# Patient Record
Sex: Male | Born: 1985 | Race: Black or African American | Hispanic: No | Marital: Single | Smoking: Former smoker
Health system: Southern US, Community
[De-identification: ages and names within clinical notes are randomized; demographics above are authoritative.]

## PROBLEM LIST (undated history)

## (undated) DIAGNOSIS — K219 Gastro-esophageal reflux disease without esophagitis: Secondary | ICD-10-CM

## (undated) DIAGNOSIS — I82629 Acute embolism and thrombosis of deep veins of unspecified upper extremity: Secondary | ICD-10-CM

## (undated) DIAGNOSIS — Z8719 Personal history of other diseases of the digestive system: Secondary | ICD-10-CM

## (undated) DIAGNOSIS — Z8711 Personal history of peptic ulcer disease: Secondary | ICD-10-CM

## (undated) HISTORY — PX: OTHER SURGICAL HISTORY: SHX169

---

## 2008-12-21 ENCOUNTER — Inpatient Hospital Stay: Payer: Self-pay | Admitting: Vascular Surgery

## 2011-09-17 ENCOUNTER — Emergency Department: Payer: Self-pay | Admitting: Emergency Medicine

## 2015-10-06 ENCOUNTER — Emergency Department
Admission: EM | Admit: 2015-10-06 | Discharge: 2015-10-06 | Disposition: A | Discharge status: Home / Self Care | Payer: Self-pay | Attending: Emergency Medicine | Admitting: Emergency Medicine

## 2015-10-06 ENCOUNTER — Encounter: Payer: Self-pay | Admitting: Emergency Medicine

## 2015-10-06 DIAGNOSIS — F17200 Nicotine dependence, unspecified, uncomplicated: Secondary | ICD-10-CM | POA: Insufficient documentation

## 2015-10-06 DIAGNOSIS — L03317 Cellulitis of buttock: Secondary | ICD-10-CM | POA: Insufficient documentation

## 2015-10-06 MED ORDER — CEPHALEXIN 500 MG PO CAPS
500.0000 mg | ORAL_CAPSULE | Freq: Once | ORAL | Status: AC
  Administered 2015-10-06: 500 mg via ORAL
  Filled 2015-10-06: qty 1

## 2015-10-06 MED ORDER — CEPHALEXIN 500 MG PO CAPS: 500.0000 mg | ORAL_CAPSULE | Freq: Three times a day (TID) | ORAL | Status: DC

## 2015-10-06 NOTE — Discharge Instructions (Signed)
Cellulitis Cellulitis is an infection of the skin and the tissue beneath it. The infected area is usually red and tender. Cellulitis occurs most often in the arms and lower legs.  CAUSES  Cellulitis is caused by bacteria that enter the skin through cracks or cuts in the skin. The most common types of bacteria that cause cellulitis are staphylococci and streptococci. SIGNS AND SYMPTOMS   Redness and warmth.  Swelling.  Tenderness or pain.  Fever. DIAGNOSIS  Your health care provider can usually determine what is wrong based on a physical exam. Blood tests may also be done. TREATMENT  Treatment usually involves taking an antibiotic medicine. HOME CARE INSTRUCTIONS   Take your antibiotic medicine as directed by your health care provider. Finish the antibiotic even if you start to feel better.  Keep the infected arm or leg elevated to reduce swelling.  Apply a warm cloth to the affected area up to 4 times per day to relieve pain.  Take medicines only as directed by your health care provider.  Keep all follow-up visits as directed by your health care provider. SEEK MEDICAL CARE IF:   You notice red streaks coming from the infected area.  Your red area gets larger or turns dark in color.  Your bone or joint underneath the infected area becomes painful after the skin has healed.  Your infection returns in the same area or another area.  You notice a swollen bump in the infected area.  You develop new symptoms.  You have a fever. SEEK IMMEDIATE MEDICAL CARE IF:   You feel very sleepy.  You develop vomiting or diarrhea.  You have a general ill feeling (malaise) with muscle aches and pains.   This information is not intended to replace advice given to you by your health care provider. Make sure you discuss any questions you have with your health care provider.   Document Released: 03/17/2005 Document Revised: 02/26/2015 Document Reviewed: 08/23/2011 Elsevier Interactive  Patient Education [employer]2016 [employer].   Your exam shows a cellulitis. You should take the prescription med as directed. Apply warm compresses to promote healing. [facility] or return as needed for wound check.

## 2015-10-06 NOTE — ED Notes (Signed)
States possible bug bite to buttocks last Thursday  Area is larger and having more pain

## 2015-10-06 NOTE — ED Provider Notes (Signed)
[facility] Emergency Department Provider Note ____________________________________________  Time seen: 1652  I have reviewed the triage vital signs and the nursing notes.  HISTORY  Chief Complaint  Abscess  HPI Kelly Brooks is a 30 y.o. male presents to the ED for evaluation of an abscess to the left buttocks. He noted the area about 4 days prior to arrival and notes increasing redness, pain, and tenderness. He denies any interim fevers, chills, sweats.There is no spontaneous drainage to the area. He notes the area is warm, firm, and tender to touch. He rates overall discomfort 6/10 in triage.  History reviewed. No pertinent past medical history.  There are no active problems to display for this patient.  History reviewed. No pertinent past surgical history.  Current Outpatient Rx  Name  Route  Sig  Dispense  Refill  . cephALEXin (KEFLEX) 500 MG capsule   Oral   Take 1 capsule (500 mg total) by mouth 3 (three) times daily.   21 capsule   0    Allergies Shellfish allergy  No family history on file.  Social History Social History  Substance Use Topics  . Smoking status: Current Every Day Smoker  . Smokeless tobacco: None  . Alcohol Use: No   Review of Systems  Constitutional: Negative for fever. Cardiovascular: Negative for chest pain. Respiratory: Negative for shortness of breath. Gastrointestinal: Negative for abdominal pain, vomiting and diarrhea. Genitourinary: Negative for dysuria. Musculoskeletal: Negative for back pain. Skin: Negative for rash.Local area of cellulitis as above. Neurological: Negative for headaches, focal weakness or numbness. ____________________________________________  PHYSICAL EXAM:  VITAL SIGNS: ED Triage Vitals  Enc Vitals Group     BP 10/06/15 1627 124/65 mmHg     Pulse Rate 10/06/15 1627 89     Resp 10/06/15 1627 18     Temp 10/06/15 1627 98.3 F (36.8 C)     Temp Source 10/06/15 1627 Oral     SpO2  10/06/15 1627 95 %     Weight 10/06/15 1627 205 lb (92.987 kg)     Height 10/06/15 1627 5\' 10"  (1.778 m)     Head Cir --      Peak Flow --      Pain Score 10/06/15 1627 6     Pain Loc --      Pain Edu? --      Excl. in GC? --    Constitutional: Alert and oriented. Well appearing and in no distress. Head: Normocephalic and atraumatic. Hematological/Lymphatic/Immunological: No cervical lymphadenopathy. Respiratory: Normal respiratory effort.  Musculoskeletal: Nontender with normal range of motion in all extremities.  Neurologic:  Normal gait without ataxia. Normal speech and language. No gross focal neurologic deficits are appreciated. Skin:  Skin is warm, dry and intact. No rash noted. Evaluation with a well demarcated, erythematous, warm, indurated cellulitic lesion to the left buttocks. There is no fluctuance, pointing, or spontaneous drainage appreciated. Psychiatric: Mood and affect are normal. Patient exhibits appropriate insight and judgment. ____________________________________________  PROCEDURES  Keflex 500 mg PO ____________________________________________  INITIAL IMPRESSION / ASSESSMENT AND PLAN / ED COURSE  Patient with a non-purulent cellulitis to the left buttocks. He is discharged with a prescription for Keflex to dose as directed for staph infection. He is advised to apply warm compresses to the region to promote healing. He should dosed medications as prescribed and return to the ED if needed for wound check. He is referred to his local primary care provider or prone to community healthcare for ongoing  management. ____________________________________________  FINAL CLINICAL IMPRESSION(S) / ED DIAGNOSES  Final diagnoses:  Cellulitis of buttock      Lissa Hoard, PA-C 10/07/15 0023  Minna Antis, MD 10/07/15 [phone]

## 2016-08-06 ENCOUNTER — Emergency Department: Payer: Self-pay

## 2016-08-06 ENCOUNTER — Emergency Department
Admission: EM | Admit: 2016-08-06 | Discharge: 2016-08-06 | Disposition: A | Discharge status: Home / Self Care | Payer: Self-pay | Attending: Emergency Medicine | Admitting: Emergency Medicine

## 2016-08-06 ENCOUNTER — Encounter: Payer: Self-pay | Admitting: Emergency Medicine

## 2016-08-06 DIAGNOSIS — R1013 Epigastric pain: Secondary | ICD-10-CM

## 2016-08-06 DIAGNOSIS — R52 Pain, unspecified: Secondary | ICD-10-CM

## 2016-08-06 DIAGNOSIS — Z8719 Personal history of other diseases of the digestive system: Secondary | ICD-10-CM

## 2016-08-06 DIAGNOSIS — K219 Gastro-esophageal reflux disease without esophagitis: Secondary | ICD-10-CM | POA: Insufficient documentation

## 2016-08-06 DIAGNOSIS — F17200 Nicotine dependence, unspecified, uncomplicated: Secondary | ICD-10-CM | POA: Insufficient documentation

## 2016-08-06 DIAGNOSIS — Z8711 Personal history of peptic ulcer disease: Secondary | ICD-10-CM

## 2016-08-06 HISTORY — DX: Personal history of peptic ulcer disease: Z87.11

## 2016-08-06 HISTORY — DX: Personal history of other diseases of the digestive system: Z87.19

## 2016-08-06 LAB — CBC
HCT: 45.8 % (ref 40.0–52.0)
Hemoglobin: 16 g/dL (ref 13.0–18.0)
MCH: 32.4 pg (ref 26.0–34.0)
MCHC: 34.9 g/dL (ref 32.0–36.0)
MCV: 92.9 fL (ref 80.0–100.0)
PLATELETS: 233 10*3/uL (ref 150–440)
RBC: 4.93 MIL/uL (ref 4.40–5.90)
RDW: 13.6 % (ref 11.5–14.5)
WBC: 9.8 10*3/uL (ref 3.8–10.6)

## 2016-08-06 LAB — COMPREHENSIVE METABOLIC PANEL
ALBUMIN: 3.9 g/dL (ref 3.5–5.0)
ALT: 23 U/L (ref 17–63)
ANION GAP: 8 (ref 5–15)
AST: 24 U/L (ref 15–41)
Alkaline Phosphatase: 49 U/L (ref 38–126)
BILIRUBIN TOTAL: 0.7 mg/dL (ref 0.3–1.2)
BUN: 13 mg/dL (ref 6–20)
CHLORIDE: 105 mmol/L (ref 101–111)
CO2: 24 mmol/L (ref 22–32)
Calcium: 8.6 mg/dL — ABNORMAL LOW (ref 8.9–10.3)
Creatinine, Ser: 0.97 mg/dL (ref 0.61–1.24)
GFR calc Af Amer: >60 mL/min (ref >60)
GFR calc non Af Amer: >60 mL/min (ref >60)
GLUCOSE: 113 mg/dL — AB (ref 65–99)
Potassium: 3.9 mmol/L (ref 3.5–5.1)
SODIUM: 137 mmol/L (ref 135–145)
Total Protein: 6.3 g/dL — ABNORMAL LOW (ref 6.5–8.1)

## 2016-08-06 LAB — LIPASE, BLOOD: LIPASE: 20 U/L (ref 11–51)

## 2016-08-06 LAB — URINALYSIS, COMPLETE (UACMP) WITH MICROSCOPIC
BACTERIA UA: NONE SEEN
Bilirubin Urine: NEGATIVE
GLUCOSE, UA: NEGATIVE mg/dL
HGB URINE DIPSTICK: NEGATIVE
Ketones, ur: NEGATIVE mg/dL
Leukocytes, UA: NEGATIVE
NITRITE: NEGATIVE
PH: 6 (ref 5.0–8.0)
Protein, ur: NEGATIVE mg/dL
SPECIFIC GRAVITY, URINE: 1.015 (ref 1.005–1.030)
Squamous Epithelial / LPF: NONE SEEN

## 2016-08-06 MED ORDER — GI COCKTAIL ~~LOC~~
30.0000 mL | Freq: Once | ORAL | Status: AC
  Administered 2016-08-06: 30 mL via ORAL
  Filled 2016-08-06: qty 30

## 2016-08-06 MED ORDER — FAMOTIDINE 20 MG PO TABS: 20.0000 mg | ORAL_TABLET | Freq: Two times a day (BID) | ORAL | 0 refills | Status: DC

## 2016-08-06 NOTE — ED Triage Notes (Signed)
Pt with LLQ pain started on Tuesday. Denies any NVD.

## 2016-08-06 NOTE — ED Provider Notes (Signed)
[facility] Emergency Department Provider Note  ____________________________________________  Time seen: Approximately 12:12 PM  I have reviewed the triage vital signs and the nursing notes.   HISTORY  Chief Complaint Abdominal Pain (LLQ)    HPI Kelly Brooks is a 31 y.o. male , NAD, presents to the emergency department for evaluation of abdominal pain. States he had onset of upper and left-sided abdominal pain Tuesday. Pain is not associated with nausea, vomiting, diarrhea, hematochezia, belching or bloating. Patient states he has a history of gastric ulcer which occurred 8 years ago. He states he was on multiple medications at that time but did not continue any chronic medications. States he has had no issues in regards to reflux or abdominal pain since that time. Does note that he has had a change in diet over the last month due to working 2 jobs. States he typically eats one time per day and consists of fast food. Has not taking anything for his current symptoms. Pain seems to worsen with bending and lifting as well as with drinking higher acidity beverages. No change in pain with eating. Has not noted any masses. Denies any sudden onset of pain. Has had no dysuria, hematuria or changes in urinary habits. Denies fevers, chills or body aches. Has no radiation of the pain. Denies chest pain, shortness of breath or wheezing. Has had no radiation of pain.   Past Medical History:  Diagnosis Date  . History of stomach ulcers     There are no active problems to display for this patient.   History reviewed. No pertinent surgical history.  Prior to Admission medications   Medication Sig Start Date End Date Taking? Authorizing Provider  cephALEXin (KEFLEX) 500 MG capsule Take 1 capsule (500 mg total) by mouth 3 (three) times daily. 10/06/15   Jenise V Bacon Menshew, PA-C  famotidine (PEPCID) 20 MG tablet Take 1 tablet (20 mg total) by mouth 2 (two) times daily.  08/06/16 09/05/16  Rakeen Gaillard L Kirstina Leinweber, PA-C    Allergies Shellfish allergy  No family history on file.  Social History Social History  Substance Use Topics  . Smoking status: Current Every Day Smoker  . Smokeless tobacco: Never Used  . Alcohol use No     Review of Systems  Constitutional: No fever/chills Cardiovascular: No chest pain. Respiratory: No cough. No shortness of breath. No wheezing.  Gastrointestinal: Positive abdominal pain without radiation.  No nausea, vomiting.  No diarrhea.  No constipation. Genitourinary: Negative for dysuria. No hematuria. No urinary hesitancy, urgency or increased frequency. Musculoskeletal: Negative for back pain nor general myalgias.  Skin: Negative for rash, redness, swelling, bruising, skin sores. Neurological: Negative for headaches, focal weakness or numbness. 10-point ROS otherwise negative.  ____________________________________________   PHYSICAL EXAM:  VITAL SIGNS: ED Triage Vitals  Enc Vitals Group     BP 08/06/16 1038 119/70     Pulse Rate 08/06/16 1038 95     Resp 08/06/16 1038 20     Temp 08/06/16 1038 98.4 F (36.9 C)     Temp Source 08/06/16 1038 Oral     SpO2 08/06/16 1038 99 %     Weight 08/06/16 1040 213 lb (96.6 kg)     Height 08/06/16 1040 5\' 10"  (1.778 m)     Head Circumference --      Peak Flow --      Pain Score 08/06/16 1043 6     Pain Loc --      Pain Edu? --  Excl. in GC? --      Constitutional: Alert and oriented. Well appearing and in no acute distress. Eyes: Conjunctivae are normal Without icterus, injection or discharge Head: Atraumatic. Neck: Supple with full range of motion. Hematological/Lymphatic/Immunilogical: No cervical lymphadenopathy. Cardiovascular: Normal rate, regular rhythm. Normal S1 and S2.  Good peripheral circulation. Respiratory: Normal respiratory effort without tachypnea or retractions. Lungs CTABBreath sounds noted in all lung fields. No wheeze, rhonchi,  rales. Gastrointestinal: Tenderness to deep palpation of the epigastric region without masses or distention. The rest of the abdomen is soft and nontender without distention or guarding. No rebound or rigidity. No hepatosplenomegaly. Musculoskeletal: No lower extremity tenderness nor edema.  No joint effusions. Neurologic:  Normal speech and language. No gross focal neurologic deficits are appreciated.  Skin:  Skin is warm, dry and intact. No rash, redness, abnormal warmth, bruising noted. Psychiatric: Mood and affect are normal. Speech and behavior are normal. Patient exhibits appropriate insight and judgement.   ____________________________________________   LABS (all labs ordered are listed, but only abnormal results are displayed)  Labs Reviewed  COMPREHENSIVE METABOLIC PANEL - Abnormal; Notable for the following:       Result Value   Glucose, Bld 113 (*)    Calcium 8.6 (*)    Total Protein 6.3 (*)    All other components within normal limits  URINALYSIS, COMPLETE (UACMP) WITH MICROSCOPIC - Abnormal; Notable for the following:    Color, Urine YELLOW (*)    APPearance CLEAR (*)    All other components within normal limits  LIPASE, BLOOD  CBC  H PYLORI, IGM, IGG, IGA AB   ____________________________________________  EKG  None ____________________________________________  RADIOLOGY I, Donnika Kucher L [place], personally viewed and evaluated these images (plain radiographs) as part of my medical decision making, as well as reviewing the written report by the radiologist.  Dg Abd 2 Views  Result Date: 08/06/2016 CLINICAL DATA:  Left lower quadrant pain for 4 days EXAM: ABDOMEN - 2 VIEW COMPARISON:  None. FINDINGS: The bowel gas pattern is normal. There is no evidence of free air. No radio-opaque calculi or other significant radiographic abnormality is seen. IMPRESSION: Normal abdominal radiographs. Electronically Signed   By: Deatra Robinson M.D.   On: 08/06/2016 14:26     ____________________________________________    PROCEDURES  Procedure(s) performed: None   Procedures   Medications  gi cocktail (Maalox,Lidocaine,Donnatal) (30 mLs Oral Given 08/06/16 1321)     ____________________________________________   INITIAL IMPRESSION / ASSESSMENT AND PLAN / ED COURSE  Pertinent labs & imaging results that were available during my care of the patient were reviewed by me and considered in my medical decision making (see chart for details).     Patient's diagnosis is consistent with Gastroesophageal reflux disease with history of gastric ulcer and epigastric pain. Patient noted decrease in symptoms after being given GI cocktail. No evidence of free air noted on abdominal x-ray. All lab work returned without significant abnormalities. H. pylori serum was sent to the lab for evaluation and patient will be called with those results when available. Patient will be discharged home with prescriptions for Pepcid to take as directed. Patient was also given exit care in regards to diet to assist in decreasing reflux symptoms. Patient was in agreement with current plan of care and all questions were answered to his satisfaction. Patient is to follow up with Dr. Earlean Polka in GI in 1 week for further evaluation and treatment. Patient is given ED precautions to return to the ED  for any worsening or new symptoms.   ____________________________________________  FINAL CLINICAL IMPRESSION(S) / ED DIAGNOSES  Final diagnoses:  Gastroesophageal reflux disease, esophagitis presence not specified  History of gastric ulcer  Epigastric pain      NEW MEDICATIONS STARTED DURING THIS VISIT:  Discharge Medication List as of 08/06/2016  2:34 PM    START taking these medications   Details  famotidine (PEPCID) 20 MG tablet Take 1 tablet (20 mg total) by mouth 2 (two) times daily., Starting Fri 08/06/2016, Until Sun 09/05/2016, Print             Ernestene KielJami L [place],  PA-C 08/06/16 1614    Merrily BrittleNeil Rifenbark, MD 08/09/16 [phone]

## 2016-08-09 LAB — H PYLORI, IGM, IGG, IGA AB
H PYLORI IGG: 1.14 {index_val} — AB (ref 0.00–0.79)
H. Pylogi, Iga Abs: <9 units (ref 0.0–8.9)

## 2016-08-11 ENCOUNTER — Telehealth: Payer: Self-pay | Admitting: Emergency Medicine

## 2016-08-12 NOTE — Telephone Encounter (Signed)
I called Kelly Brooks yesterday asking him to call me today.  I wanted to verify that he has made appt with the GI doctor and explain that he may need further testing for hpylori.  He has not called me back yet.

## 2018-07-06 ENCOUNTER — Other Ambulatory Visit: Payer: Self-pay

## 2018-07-06 ENCOUNTER — Encounter: Payer: Self-pay | Admitting: Emergency Medicine

## 2018-07-06 ENCOUNTER — Emergency Department: Payer: Self-pay

## 2018-07-06 ENCOUNTER — Emergency Department
Admission: EM | Admit: 2018-07-06 | Discharge: 2018-07-06 | Disposition: A | Discharge status: Home / Self Care | Payer: Self-pay | Attending: Emergency Medicine | Admitting: Emergency Medicine

## 2018-07-06 DIAGNOSIS — M25511 Pain in right shoulder: Secondary | ICD-10-CM | POA: Insufficient documentation

## 2018-07-06 DIAGNOSIS — F17210 Nicotine dependence, cigarettes, uncomplicated: Secondary | ICD-10-CM | POA: Insufficient documentation

## 2018-07-06 DIAGNOSIS — R52 Pain, unspecified: Secondary | ICD-10-CM

## 2018-07-06 DIAGNOSIS — M79601 Pain in right arm: Secondary | ICD-10-CM | POA: Insufficient documentation

## 2018-07-06 LAB — CBC WITH DIFFERENTIAL/PLATELET
ABS IMMATURE GRANULOCYTES: 0.04 10*3/uL (ref 0.00–0.07)
BASOS ABS: 0.1 10*3/uL (ref 0.0–0.1)
Basophils Relative: 1 %
EOS ABS: 0.2 10*3/uL (ref 0.0–0.5)
Eosinophils Relative: 2 %
HEMATOCRIT: 47.2 % (ref 39.0–52.0)
HEMOGLOBIN: 16.3 g/dL (ref 13.0–17.0)
IMMATURE GRANULOCYTES: 0 %
LYMPHS ABS: 2.1 10*3/uL (ref 0.7–4.0)
LYMPHS PCT: 21 %
MCH: 31.7 pg (ref 26.0–34.0)
MCHC: 34.5 g/dL (ref 30.0–36.0)
MCV: 91.8 fL (ref 80.0–100.0)
MONOS PCT: 5 %
Monocytes Absolute: 0.5 10*3/uL (ref 0.1–1.0)
NEUTROS ABS: 7 10*3/uL (ref 1.7–7.7)
NEUTROS PCT: 71 %
Platelets: 313 10*3/uL (ref 150–400)
RBC: 5.14 MIL/uL (ref 4.22–5.81)
RDW: 13.2 % (ref 11.5–15.5)
WBC: 9.9 10*3/uL (ref 4.0–10.5)
nRBC: 0 % (ref 0.0–0.2)

## 2018-07-06 MED ORDER — BACLOFEN 10 MG PO TABS: 10.0000 mg | ORAL_TABLET | Freq: Three times a day (TID) | ORAL | 1 refills | Status: DC

## 2018-07-06 MED ORDER — PREDNISONE 10 MG (21) PO TBPK: ORAL_TABLET | ORAL | 0 refills | Status: DC

## 2018-07-06 NOTE — ED Provider Notes (Signed)
[facility] Emergency Department Provider Note  ____________________________________________   First MD Initiated Contact with Patient 07/06/18 1127     (approximate)  I have reviewed the triage vital signs and the nursing notes.   HISTORY  Chief Complaint Shoulder Pain    HPI Kelly Brooks is a 34 y.o. male presents emergency department complaining of right shoulder right arm pain.  He states the pain is sharp and radiates up and down the arm.  He is given plasma 3 times with the latest donation being today.  He states he continues to have right arm pain and felt like it was swollen and warm.  He denies any fever chills.  Denies any IV drug use.  He denies any injury.    Past Medical History:  Diagnosis Date  . History of stomach ulcers     There are no active problems to display for this patient.   History reviewed. No pertinent surgical history.  Prior to Admission medications   Medication Sig Start Date End Date Taking? Authorizing Provider  baclofen (LIORESAL) 10 MG tablet Take 1 tablet (10 mg total) by mouth 3 (three) times daily. 07/06/18 07/06/19  Ladd Cen, Roselyn Bering, PA-C  predniSONE (STERAPRED UNI-PAK 21 TAB) 10 MG (21) TBPK tablet Take 6 pills on day one then decrease by 1 pill each day 07/06/18   Faythe Ghee, PA-C    Allergies Shellfish allergy  No family history on file.  Social History Social History   Tobacco Use  . Smoking status: Current Every Day Smoker  . Smokeless tobacco: Never Used  Substance Use Topics  . Alcohol use: No  . Drug use: Not on file    Review of Systems  Constitutional: No fever/chills Eyes: No visual changes. ENT: No sore throat. Respiratory: Denies cough Genitourinary: Negative for dysuria. Musculoskeletal: Negative for back pain.  Positive right shoulder pain Skin: Negative for rash.    ____________________________________________   PHYSICAL EXAM:  VITAL SIGNS: ED Triage Vitals  Enc  Vitals Group     BP 07/06/18 1056 (!) 146/98     Pulse Rate 07/06/18 1056 74     Resp --      Temp 07/06/18 1056 98.7 F (37.1 C)     Temp Source 07/06/18 1056 Oral     SpO2 07/06/18 1056 98 %     Weight 07/06/18 1055 220 lb (99.8 kg)     Height 07/06/18 1055 5\' 9"  (1.753 m)     Head Circumference --      Peak Flow --      Pain Score 07/06/18 1055 10     Pain Loc --      Pain Edu? --      Excl. in GC? --     Constitutional: Alert and oriented. Well appearing and in no acute distress. Eyes: Conjunctivae are normal.  Head: Atraumatic. Nose: No congestion/rhinnorhea. Mouth/Throat: Mucous membranes are moist.   Neck:  supple no lymphadenopathy noted Cardiovascular: Normal rate, regular rhythm. Heart sounds are normal Respiratory: Normal respiratory effort.  No retractions, lungs c t a  GU: deferred Musculoskeletal: FROM all extremities, warm and well perfused, the right shoulder is mildly tender, right humerus and bicep area are tender and swollen.  Positive recent IV access noted in the antecubital area.  No redness or drainage is noted from the site.  Neurovascular is intact. Neurologic:  Normal speech and language.  Skin:  Skin is warm, dry and intact. No rash noted. Psychiatric:  Mood and affect are normal. Speech and behavior are normal.  ____________________________________________   LABS (all labs ordered are listed, but only abnormal results are displayed)  Labs Reviewed  CBC WITH DIFFERENTIAL/PLATELET   ____________________________________________   ____________________________________________  RADIOLOGY  X-ray of the right shoulder is negative Ultrasound of the right upper extremity is negative for DVT  ____________________________________________   PROCEDURES  Procedure(s) performed: No  Procedures    ____________________________________________   INITIAL IMPRESSION / ASSESSMENT AND PLAN / ED COURSE  Pertinent labs & imaging results that were  available during my care of the patient were reviewed by me and considered in my medical decision making (see chart for details).   Patient is 33 year old male presents emergency department complaint of right shoulder pain.  Physical exam shows a right swollen upper arm with recent IV access noted.  X-ray of the right shoulder is negative, ultrasound the right shoulder is negative for DVT or abscess.  CBC has a normal WBC  Explained all the findings to the patient.  He would like a work note for today as he has to rest of the weekend off.  He was given a prescription for Sterapred and baclofen.  He is to follow-up with his regular doctor if not better in 5 to 7 days.  Return emergency department worsening.  States he understands will comply.  Was discharged stable condition.     As part of my medical decision making, I reviewed the following data within the electronic MEDICAL RECORD NUMBER Nursing notes reviewed and incorporated, Labs reviewed CBC normal, Old chart reviewed, Radiograph reviewed x-ray of the right shoulder is negative, ultrasound of the right upper extremity is negative for DVT or abscess, Notes from prior ED visits and [place] Controlled Substance Database  ____________________________________________   FINAL CLINICAL IMPRESSION(S) / ED DIAGNOSES  Final diagnoses:  Pain  Acute pain of right shoulder      NEW MEDICATIONS STARTED DURING THIS VISIT:  Discharge Medication List as of 07/06/2018  1:39 PM    START taking these medications   Details  baclofen (LIORESAL) 10 MG tablet Take 1 tablet (10 mg total) by mouth 3 (three) times daily., Starting Thu 07/06/2018, Until Fri 07/06/2019, Normal    predniSONE (STERAPRED UNI-PAK 21 TAB) 10 MG (21) TBPK tablet Take 6 pills on day one then decrease by 1 pill each day, Normal         Note:  This document was prepared using Dragon voice recognition software and may include unintentional dictation errors.    Faythe Ghee,  PA-C 07/06/18 1353    Jene Every, MD 07/06/18 [phone]

## 2018-07-06 NOTE — Discharge Instructions (Addendum)
Follow-up with your regular doctor if not better in 5 to 7 days or orthopedics.  Use the medications as prescribed.  Return if worsening.  Apply ice to the right shoulder.

## 2018-07-06 NOTE — ED Notes (Signed)
Pt off floor, will draw blood when he returns

## 2018-07-06 NOTE — ED Notes (Signed)
See triage note  Presents with pain to left shoulder/back area  States pain started on Monday and describes as sharp  Denies any injury  Good pulses

## 2018-07-06 NOTE — ED Triage Notes (Signed)
C/O right arm, shoulder pain radiating to mid back.  Pain started Monday at noon.

## 2018-11-03 ENCOUNTER — Emergency Department: Payer: Self-pay

## 2018-11-03 ENCOUNTER — Other Ambulatory Visit: Payer: Self-pay

## 2018-11-03 ENCOUNTER — Encounter: Payer: Self-pay | Admitting: Emergency Medicine

## 2018-11-03 ENCOUNTER — Emergency Department
Admission: EM | Admit: 2018-11-03 | Discharge: 2018-11-03 | Disposition: A | Discharge status: Home / Self Care | Payer: Self-pay | Attending: Emergency Medicine | Admitting: Emergency Medicine

## 2018-11-03 DIAGNOSIS — F17210 Nicotine dependence, cigarettes, uncomplicated: Secondary | ICD-10-CM | POA: Insufficient documentation

## 2018-11-03 DIAGNOSIS — Z79899 Other long term (current) drug therapy: Secondary | ICD-10-CM | POA: Insufficient documentation

## 2018-11-03 DIAGNOSIS — R202 Paresthesia of skin: Secondary | ICD-10-CM | POA: Insufficient documentation

## 2018-11-03 DIAGNOSIS — M25511 Pain in right shoulder: Secondary | ICD-10-CM | POA: Insufficient documentation

## 2018-11-03 MED ORDER — PREDNISONE 10 MG (21) PO TBPK: ORAL_TABLET | ORAL | 0 refills | Status: DC

## 2018-11-03 MED ORDER — PREDNISONE 20 MG PO TABS
60.0000 mg | ORAL_TABLET | Freq: Once | ORAL | Status: AC
  Administered 2018-11-03: 60 mg via ORAL
  Filled 2018-11-03: qty 3

## 2018-11-03 MED ORDER — CYCLOBENZAPRINE HCL 10 MG PO TABS: 10.0000 mg | ORAL_TABLET | Freq: Three times a day (TID) | ORAL | 0 refills | Status: DC | PRN

## 2018-11-03 MED ORDER — HYDROCODONE-ACETAMINOPHEN 5-325 MG PO TABS: 1.0000 | ORAL_TABLET | ORAL | 0 refills | Status: DC | PRN

## 2018-11-03 NOTE — Discharge Instructions (Signed)
Please follow-up with orthopedics if symptoms are not improving with rest and medications.  Return to the emergency department for symptoms that change or worsen if you are unable to schedule an appointment.

## 2018-11-03 NOTE — ED Triage Notes (Signed)
Woke up this am with pain right arm.  Pain shoot up arm if he balls his fist.

## 2018-11-03 NOTE — ED Provider Notes (Signed)
[facility] Emergency Department Provider Note ____________________________________________  Time seen: Approximately 11:43 AM  I have reviewed the triage vital signs and the nursing notes.   HISTORY  Chief Complaint Shoulder Pain    HPI Kelly Brooks is a 33 y.o. male who presents to the emergency department for evaluation and treatment of pain in the right shoulder with paresthesias in the right arm and decreased in skin temperature.  Patient states that he recalls going to sleep with his arms folded behind his head last night and when he awakened in the middle of the night his arm felt numb and tingly.  He assumed that this would resolve on its own, but it has not.  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  He states that he took a muscle relaxer in hopes that it would help with the pain, however he has had no relief and decided to come to the emergency department.  Past Medical History:  Diagnosis Date  . History of stomach ulcers     There are no active problems to display for this patient.   History reviewed. No pertinent surgical history.  Prior to Admission medications   Medication Sig Start Date End Date Taking? Authorizing Provider  baclofen (LIORESAL) 10 MG tablet Take 1 tablet (10 mg total) by mouth 3 (three) times daily. 07/06/18 07/06/19  Fisher, Roselyn Bering, PA-C  cyclobenzaprine (FLEXERIL) 10 MG tablet Take 1 tablet (10 mg total) by mouth 3 (three) times daily as needed. 11/03/18   Navi Erber, Rulon Eisenmenger B, FNP  HYDROcodone-acetaminophen (NORCO/VICODIN) 5-325 MG tablet Take 1 tablet by mouth every 4 (four) hours as needed for moderate pain. 11/03/18 11/03/19  Orianna Biskup, Kasandra Knudsen, FNP  predniSONE (STERAPRED UNI-PAK 21 TAB) 10 MG (21) TBPK tablet Take 6 tablets on the first day and decrease by 1 tablet each day until finished. 11/03/18   Chinita Pester, FNP    Allergies Shellfish allergy  No  family history on file.  Social History Social History   Tobacco Use  . Smoking status: Current Every Day Smoker  . Smokeless tobacco: Never Used  Substance Use Topics  . Alcohol use: No  . Drug use: Not on file    Review of Systems Constitutional: Negative for fever. Cardiovascular: Negative for chest pain. Respiratory: Negative for shortness of breath. Musculoskeletal: Positive for right shoulder pain Skin: Negative for open wounds or lesions Neurological: Positive for decrease in sensation of the right forearm into the hand  ____________________________________________   PHYSICAL EXAM:  VITAL SIGNS: ED Triage Vitals  Enc Vitals Group     BP 11/03/18 1029 131/80     Pulse Rate 11/03/18 1029 88     Resp 11/03/18 1029 18     Temp 11/03/18 1029 98 F (36.7 C)     Temp Source 11/03/18 1029 Oral     SpO2 11/03/18 1029 97 %     Weight 11/03/18 1025 227 lb (103 kg)     Height 11/03/18 1025 5\' 11"  (1.803 m)     Head Circumference --      Peak Flow --      Pain Score 11/03/18 1024 8     Pain Loc --      Pain Edu? --      Excl. in GC? --     Constitutional: Alert and oriented. Well appearing and in no acute distress. Eyes: Conjunctivae are clear without discharge  or drainage Head: Atraumatic Neck: Supple. Respiratory: No cough. Respirations are even and unlabored. Musculoskeletal: Full, active range of motion demonstrated of the right shoulder, elbow, wrist, and fingers.  No focal bony abnormality is identified on exam. Neurologic: Right forearm from the elbow to the fingers are cooler in temperature as compared to the left side.  Patient is still able to perform range of motion with thumb opposition to all fingers.  Grip strength in the right and left hands are equal. Skin: Right forearm right hand and fingers are blanchable but cooler in comparison to the left.  Psychiatric: Affect and behavior are appropriate.  ____________________________________________    LABS (all labs ordered are listed, but only abnormal results are displayed)  Labs Reviewed - No data to display ____________________________________________  RADIOLOGY  Ultrasound of the right upper extremity does not show any concern for DVT. ____________________________________________   PROCEDURES  Procedures  ____________________________________________   INITIAL IMPRESSION / ASSESSMENT AND PLAN / ED COURSE  Kelly Brooks is a 33 y.o. who presents to the emergency department for treatment and evaluation of nontraumatic right upper extremity pain.  Ultrasound is negative for DVT.  Do feel like that there is some nerve compression likely in the shoulder that is causing current symptoms.  He will be placed on a prednisone taper and given pain medication and muscle relaxers.  He was encouraged to avoid repetitive motion and avoid sleeping with his arms behind his head.  If he is not improving over the weekend, he is to call and schedule appointment with orthopedics.  He was encouraged to return to the emergency department for symptoms that change or worsen if he is unable to schedule appointment with primary care or orthopedics.  Medications  predniSONE (DELTASONE) tablet 60 mg (60 mg Oral Given 11/03/18 1246)    Pertinent labs & imaging results that were available during my care of the patient were reviewed by me and considered in my medical decision making (see chart for details).  _________________________________________   FINAL CLINICAL IMPRESSION(S) / ED DIAGNOSES  Final diagnoses:  Paresthesia of arm  Acute pain of right shoulder    ED Discharge Orders         Ordered    predniSONE (STERAPRED UNI-PAK 21 TAB) 10 MG (21) TBPK tablet     11/03/18 1442    HYDROcodone-acetaminophen (NORCO/VICODIN) 5-325 MG tablet  Every 4 hours PRN     11/03/18 1442    cyclobenzaprine (FLEXERIL) 10 MG tablet  3 times daily PRN     11/03/18 1442           If controlled  substance prescribed during this visit, 12 month history viewed on the [place]RS prior to issuing an initial prescription for Schedule II or III opiod.   Chinita Pesterriplett, Kaaren Nass B, FNP 11/03/18 1543    Dionne BucySiadecki, Sebastian, MD 11/03/18 1552

## 2018-11-13 ENCOUNTER — Emergency Department: Payer: Self-pay

## 2018-11-13 ENCOUNTER — Other Ambulatory Visit: Payer: Self-pay

## 2018-11-13 ENCOUNTER — Emergency Department
Admission: EM | Admit: 2018-11-13 | Discharge: 2018-11-13 | Disposition: A | Discharge status: Home / Self Care | Payer: Self-pay | Attending: Emergency Medicine | Admitting: Emergency Medicine

## 2018-11-13 ENCOUNTER — Encounter: Payer: Self-pay | Admitting: Emergency Medicine

## 2018-11-13 DIAGNOSIS — M79601 Pain in right arm: Secondary | ICD-10-CM

## 2018-11-13 DIAGNOSIS — M25511 Pain in right shoulder: Secondary | ICD-10-CM | POA: Insufficient documentation

## 2018-11-13 DIAGNOSIS — R200 Anesthesia of skin: Secondary | ICD-10-CM | POA: Insufficient documentation

## 2018-11-13 DIAGNOSIS — Z79899 Other long term (current) drug therapy: Secondary | ICD-10-CM | POA: Insufficient documentation

## 2018-11-13 DIAGNOSIS — F17200 Nicotine dependence, unspecified, uncomplicated: Secondary | ICD-10-CM | POA: Insufficient documentation

## 2018-11-13 MED ORDER — PREDNISONE 50 MG PO TABS: ORAL_TABLET | ORAL | 0 refills | Status: DC

## 2018-11-13 MED ORDER — TRAMADOL HCL 50 MG PO TABS: 50.0000 mg | ORAL_TABLET | Freq: Four times a day (QID) | ORAL | 0 refills | Status: DC | PRN

## 2018-11-13 MED ORDER — METHYLPREDNISOLONE SODIUM SUCC 125 MG IJ SOLR
125.0000 mg | Freq: Once | INTRAMUSCULAR | Status: AC
  Administered 2018-11-13: 125 mg via INTRAMUSCULAR
  Filled 2018-11-13: qty 2

## 2018-11-13 MED ORDER — METAXALONE 800 MG PO TABS: 800.0000 mg | ORAL_TABLET | Freq: Three times a day (TID) | ORAL | 0 refills | Status: DC

## 2018-11-13 NOTE — Discharge Instructions (Addendum)
You likely have a nerve that travels down your arm that is inflamed.  I have given you a different muscle relaxer to try and some pain medication.  Continue to wear your arm sling.  Please be sure to call orthopedics tomorrow morning for an appointment within the next week for recheck and for additional testing.  Return to the emergency department for any worsening of symptoms.

## 2018-11-13 NOTE — ED Provider Notes (Signed)
[facility] Emergency Department Provider Note  ____________________________________________  Time seen: Approximately 9:47 AM  I have reviewed the triage vital signs and the nursing notes.   HISTORY  Chief Complaint Shoulder Pain    HPI Kelly Brooks is a 33 y.o. male presents to the emergency department for evaluation of pain and numbness to right shoulder and forearm for 2 weeks.  Patient was evaluated in the emergency department 10 days ago.  He states that he was doubling up on the pain medication, which took the edge off.  He thinks muscle relaxers did benefit as well.  He finished a steroid taper pack on Thursday.  Pain returned  this morning.  He states the pain to the shoulder seems to be improved, but pain to his forearm is worse.  He notices more pain to his first, second, and third digits currently.  Occasionally he also has pain though to the fourth and fifth digit.  Sometimes his fingers will feel cool.  Pain is worse at night and in the morning.  Pain seems to improve about an hour after waking up.  He does have some pain to the right side of his neck last week but has resolved today.  He was prescribed a shoulder sling but was unable to get it and recently ordered one online which has just arrived.  Pain improves when he has his sling positioned a certain way.  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  He was given a referral to orthopedics but did not get around to making the appointment.  No specific trauma.   Past Medical History:  Diagnosis Date  . History of stomach ulcers     There are no active problems to display for this patient.   History reviewed. No pertinent surgical history.  Prior to Admission medications   Medication Sig Start Date End Date Taking? Authorizing Provider  baclofen (LIORESAL) 10 MG tablet Take 1 tablet (10 mg total) by mouth 3 (three) times daily. 07/06/18 07/06/19  Fisher, Roselyn Bering, PA-C  cyclobenzaprine  (FLEXERIL) 10 MG tablet Take 1 tablet (10 mg total) by mouth 3 (three) times daily as needed. 11/03/18   Triplett, Rulon Eisenmenger B, FNP  HYDROcodone-acetaminophen (NORCO/VICODIN) 5-325 MG tablet Take 1 tablet by mouth every 4 (four) hours as needed for moderate pain. 11/03/18 11/03/19  Triplett, Rulon Eisenmenger B, FNP  metaxalone (SKELAXIN) 800 MG tablet Take 1 tablet (800 mg total) by mouth 3 (three) times daily. 11/13/18   Enid Derry, PA-C  predniSONE (DELTASONE) 50 MG tablet Take 1 tablet per day 11/13/18   Enid Derry, PA-C    Allergies Shellfish allergy  No family history on file.  Social History Social History   Tobacco Use  . Smoking status: Current Every Day Smoker  . Smokeless tobacco: Never Used  Substance Use Topics  . Alcohol use: No  . Drug use: Not on file     Review of Systems  Cardiovascular: No chest pain. Respiratory: No SOB. Gastrointestinal: No abdominal pain.  No nausea, no vomiting.  Musculoskeletal: Positive for arm pain. Skin: Negative for rash, abrasions, lacerations, ecchymosis. Neurological: Negative for headaches, tingling.  Positive for numbness.   ____________________________________________   PHYSICAL EXAM:  VITAL SIGNS: ED Triage Vitals [11/13/18 0928]  Enc Vitals Group     BP (!) 156/92     Pulse Rate 100     Resp 18     Temp 98.5 F (36.9 C)  Temp Source Oral     SpO2 98 %     Weight 227 lb (103 kg)     Height 5\' 10"  (1.778 m)     Head Circumference      Peak Flow      Pain Score 10     Pain Loc      Pain Edu?      Excl. in GC?      Constitutional: Alert and oriented. Well appearing and in no acute distress. Eyes: Conjunctivae are normal. PERRL. EOMI. Head: Atraumatic. ENT:      Ears:      Nose: No congestion/rhinnorhea.      Mouth/Throat: Mucous membranes are moist.  Neck: No stridor.  No cervical spine tenderness to palpation.  Full range of motion of neck without pain. No neck swelling or neck pain.  Cardiovascular: Normal rate,  regular rhythm.  Good peripheral circulation.  Symmetric radial pulses bilaterally. Respiratory: Normal respiratory effort without tachypnea or retractions. Lungs CTAB. Good air entry to the bases with no decreased or absent breath sounds. Musculoskeletal: Full range of motion to all extremities. No gross deformities appreciated.  Full range of motion of bilateral upper extremities.  Strength equal in upper extremities bilaterally.  Sensation intact to all 5 digits of the right hand. Neurologic:  Normal speech and language. No gross focal neurologic deficits are appreciated.  Skin:  Skin is warm, dry and intact. No rash noted. Psychiatric: Mood and affect are normal. Speech and behavior are normal. Patient exhibits appropriate insight and judgement.   ____________________________________________   LABS (all labs ordered are listed, but only abnormal results are displayed)  Labs Reviewed - No data to display ____________________________________________  EKG   ____________________________________________  RADIOLOGY Lexine Baton, personally viewed and evaluated these images (plain radiographs) as part of my medical decision making, as well as reviewing the written report by the radiologist.  Dg Cervical Spine 2-3 Views  Result Date: 11/13/2018 CLINICAL DATA:  Left arm tingling. EXAM: CERVICAL SPINE - 2-3 VIEW COMPARISON:  None. FINDINGS: Vertebral alignment is normal. No fracture is identified. Intervertebral disc space heights are preserved. Prevertebral soft tissues are within normal limits. Visualized lung apices are clear. IMPRESSION: Negative cervical spine radiographs. Electronically Signed   By: Sebastian Ache M.D.   On: 11/13/2018 10:50    ____________________________________________    PROCEDURES  Procedure(s) performed:    Procedures    Medications  methylPREDNISolone sodium succinate (SOLU-MEDROL) 125 mg/2 mL injection 125 mg (125 mg Intramuscular Given 11/13/18  1128)     ____________________________________________   INITIAL IMPRESSION / ASSESSMENT AND PLAN / ED COURSE  Pertinent labs & imaging results that were available during my care of the patient were reviewed by me and considered in my medical decision making (see chart for details).  Review of the [place] CSRS was performed in accordance of the [facility] prior to dispensing any controlled drugs.     Patient presented to emergency department for evaluation of right upper extremity pain and paresthesias.  Patient had a negative shoulder x-ray in January.  Cervical x-ray today negative for acute bony abnormalities.  He has had 2 negative right upper extremity ultrasounds, one in January and 10 days ago.  There is likely some nerve compression causing his symptoms.  Vital signs and exam are reassuring.  Patient has a history of stomach ulcer so we will stay away from anti-inflammatories.  He recently finished a steroid taper so we will not re-put him on steroids.  We will change his muscle relaxers.  I encouraged the importance of following up with orthopedics and he is agreeable to call in the morning.  Patient will be discharged home with prescriptions for prednisone and skelaxin. Patient is to follow up with orthopedics as directed. Patient is given ED precautions to return to the ED for any worsening or new symptoms.     ____________________________________________  FINAL CLINICAL IMPRESSION(S) / ED DIAGNOSES  Final diagnoses:  Acute pain of right shoulder  Right arm pain      NEW MEDICATIONS STARTED DURING THIS VISIT:  Skelaxin    This chart was dictated using voice recognition software/Dragon. Despite best efforts to proofread, errors can occur which can change the meaning. Any change was purely unintentional.    Enid DerryWagner, Fuquan Wilson, PA-C 11/13/18 1616    Sharyn CreamerQuale, Mark, MD 11/14/18 1630

## 2018-11-13 NOTE — ED Triage Notes (Signed)
Patient reports pain to right shoulder causing pain to shoot down his right arm and to his fingers. Patient was seen for same previously but has not followed up with orthopedist. Patient reports medication have worked, but he has had to "double up" on them.

## 2018-11-13 NOTE — ED Notes (Signed)

## 2018-12-05 ENCOUNTER — Emergency Department (HOSPITAL_COMMUNITY)
Admission: EM | Admit: 2018-12-05 | Discharge: 2018-12-05 | Disposition: A | Discharge status: Home / Self Care | Payer: Self-pay | Attending: Emergency Medicine | Admitting: Emergency Medicine

## 2018-12-05 ENCOUNTER — Encounter (HOSPITAL_COMMUNITY): Payer: Self-pay | Admitting: Emergency Medicine

## 2018-12-05 ENCOUNTER — Other Ambulatory Visit: Payer: Self-pay

## 2018-12-05 ENCOUNTER — Ambulatory Visit
Admission: EM | Admit: 2018-12-05 | Discharge: 2018-12-05 | Disposition: A | Discharge status: Other Acute Inpatient Hospital | Payer: Self-pay

## 2018-12-05 DIAGNOSIS — L819 Disorder of pigmentation, unspecified: Secondary | ICD-10-CM

## 2018-12-05 DIAGNOSIS — R202 Paresthesia of skin: Secondary | ICD-10-CM

## 2018-12-05 DIAGNOSIS — F17210 Nicotine dependence, cigarettes, uncomplicated: Secondary | ICD-10-CM | POA: Insufficient documentation

## 2018-12-05 DIAGNOSIS — N200 Calculus of kidney: Secondary | ICD-10-CM | POA: Insufficient documentation

## 2018-12-05 DIAGNOSIS — M79644 Pain in right finger(s): Secondary | ICD-10-CM

## 2018-12-05 MED ORDER — AMLODIPINE BESYLATE 5 MG PO TABS: 5.0000 mg | ORAL_TABLET | Freq: Every day | ORAL | 0 refills | Status: DC

## 2018-12-05 NOTE — ED Triage Notes (Signed)
Patient complains of thumb, index and middle finger on his right arm are numb and discolored. Patient states that his fingers dont feel like he has circulation in it. Patient states that his fingers hurt. Patient states that he originally started having shoulder pain and numbness one month ago and was seen in ED. Patient followed up with Ortho on 11/30/2018 and had a nerve conduction study. States that he was told this was normal. Patient states that he is concerned about his fingers. States that he had ultrasounds to rule out blood clots as well.

## 2018-12-05 NOTE — Discharge Instructions (Addendum)
Try the amlodipine daily to help with your symptoms.  If you start to feel dizzy or lightheaded, especially when you stand up, stop taking the medication and follow up with primary care.  It is important that you follow up with primary care for further evaluation and management. You may need dose changes of your medication or further evaluation by a specialist.  Return to the ER with any new, worsening, or concerning symptoms.

## 2018-12-05 NOTE — ED Provider Notes (Signed)
[facility] Provider Note   CSN: [id number] Arrival date & time: 12/05/18  1347     History   Chief Complaint No chief complaint on file.   HPI Kelly Brooks is a 33 y.o. male presenting for evaluation of discoloration of his fingers.  Patient states over the past month, he has had gradually worsening discoloration of his thumb, index, and middle fingers.  Patient states symptoms are worse when it is cold, improved with warmth. He has associated pain and tingling of the 3 fingers. There is no precipitating event such as trauma.  Patient has had this evaluated [facility] including ultrasounds and x-rays.  He has had a nerve conduction study with Ortho which was negative.  Patient was seen at urgent care today, they had difficulty picking up a pulse ox on those 3 fingers, and thus sent him to the ER for further evaluation.  Patient states he has no medical problems, takes no medications daily.     HPI  Past Medical History:  Diagnosis Date  . History of stomach ulcers     There are no active problems to display for this patient.   History reviewed. No pertinent surgical history.      Home Medications    Prior to Admission medications   Medication Sig Start Date End Date Taking? Authorizing Provider  amLODipine (NORVASC) 5 MG tablet Take 1 tablet (5 mg total) by mouth daily. 12/05/18   Safiyah Cisney, PA-C  HYDROcodone-acetaminophen (NORCO/VICODIN) 5-325 MG tablet Take 1 tablet by mouth every 4 (four) hours as needed for moderate pain. 11/03/18 11/03/19  Triplett, Rulon Eisenmengerari B, FNP  metaxalone (SKELAXIN) 800 MG tablet Take 1 tablet (800 mg total) by mouth 3 (three) times daily. 11/13/18   Enid DerryWagner, Ashley, PA-C    Family History No family history on file.  Social History Social History   Tobacco Use  . Smoking status: Current Every Day Smoker    Packs/day: 1.00    Types: Cigarettes  . Smokeless tobacco: Never Used   Substance Use Topics  . Alcohol use: No  . Drug use: Not Currently     Allergies   Shellfish allergy   Review of Systems Review of Systems  Musculoskeletal: Positive for myalgias (thumb, index, and middle finger).  Skin: Positive for color change.  Hematological: Does not bruise/bleed easily.     Physical Exam Updated Vital Signs BP 121/85 (BP Location: Right Arm)   Pulse 75   Temp 98.3 F (36.8 C) (Oral)   Resp 14   Ht 5\' 10"  (1.778 m)   Wt 107.5 kg   SpO2 100%   BMI 34.01 kg/m   Physical Exam Vitals signs and nursing note reviewed.  Constitutional:      General: He is not in acute distress.    Appearance: He is well-developed.     Comments: Appears nontoxic  HENT:     Head: Normocephalic and atraumatic.  Eyes:     Extraocular Movements: Extraocular movements intact.     Conjunctiva/sclera: Conjunctivae normal.     Pupils: Pupils are equal, round, and reactive to light.  Neck:     Musculoskeletal: Normal range of motion and neck supple.  Cardiovascular:     Rate and Rhythm: Normal rate and regular rhythm.     Pulses: Normal pulses.  Pulmonary:     Effort: No respiratory distress.     Breath sounds: Normal breath sounds. No wheezing.  Abdominal:  General: There is no distension.     Palpations: Abdomen is soft. There is no mass.     Tenderness: There is no abdominal tenderness. There is no guarding or rebound.  Musculoskeletal: Normal range of motion.       Hands:     Comments: Good pulse with doppler at all 3 locations noted in the picture above.  Skin:    Comments: See pictures in previous note.  Gradual discoloration/duskiness towards the distal aspect of the thumb, index, and middle finger of the right hand.  Full active range of motion of all fingers.  Does not extend into the palm.  Neurological:     Mental Status: He is alert and oriented to person, place, and time.      ED Treatments / Results  Labs (all labs ordered are listed, but  only abnormal results are displayed) Labs Reviewed - No data to display  EKG    Radiology No results found.  Procedures Procedures (including critical care time)  Medications Ordered in ED Medications - No data to display   Initial Impression / Assessment and Plan / ED Course  I have reviewed the triage vital signs and the nursing notes.  Pertinent labs & imaging results that were available during my care of the patient were reviewed by me and considered in my medical decision making (see chart for details).        Pt presenting for evaluation 1 month history of gradually worsening duskiness of 3 fingers.  Physical exam reassuring, he appears nontoxic.  Patient with good pulse on Doppler.  Symptoms worsen in the cold, improved in the warmth.  Have been going on for over a month.  Low suspicion for acute occlusive event or limb threatening ischemia.  Likely ray nods.  Case discussed with attending, Dr. Zenia Resides reviewed the pictures.  Discussed a trial of a calcium channel blocker for symptom improvement.  I discussed with patient, and stressed importance of follow-up with primary care for further evaluation and monitoring.  Discussed lifestyle changes such as smoking cessation.  At this time, patient appears safe for discharge.  Return precautions given.  Patient states he understands and agrees plan.  Final Clinical Impressions(s) / ED Diagnoses   Final diagnoses:  Discoloration of skin of finger    ED Discharge Orders         Ordered    amLODipine (NORVASC) 5 MG tablet  Daily     12/05/18 1457           Marchele Decock, PA-C 12/05/18 1619    Lacretia Leigh, MD 12/06/18 1539

## 2018-12-05 NOTE — ED Notes (Signed)
Patient verbalizes understanding of discharge instructions. Opportunity for questioning and answers were provided. Armband removed by staff, pt discharged from ED ambulatory to home.  

## 2018-12-05 NOTE — ED Triage Notes (Signed)
Pt here with complaints of right hand numbness and tingling for about a month. Pt denies being stung or bitten by anything.

## 2018-12-05 NOTE — Discharge Instructions (Signed)
I have spoken with the orthopedist that you saw on 11/20/2018. He is recommending evaluation in the emergency room.   Leave [facility] and go straight to [facility]. I will call them and let them know you are coming.  Honor Loh, MSN, APRN, FNP-C, CEN Advanced Practice Provider [facility] 12/05/2018 12:20 PM

## 2018-12-05 NOTE — ED Provider Notes (Signed)
[address], [place], [place] [address] [phone]   Name: Kelly Brooks DOB: [birth year] MRN: [id number] CSN: [id number] PCP: Patient, No Pcp Per  Arrival date and time:  12/05/18 1100  Chief Complaint:  Circulatory Problem and Numbness  NOTE: Prior to seeing the patient today, I have reviewed the triage nursing documentation and vital signs. Clinical staff has updated patient's PMH/PSHx, current medication list, and drug allergies/intolerances to ensure comprehensive history available to assist in medical decision making.   History:   HPI: Kelly Brooks is a 33 y.o. male who presents today with complaints of pain and swelling in the first 3 digits of his RIGHT hand that has worsened over the last few days.  Patient notes that symptoms initially started x 1 month ago.  He denies injury.  Patient stating that he woke up 1 morning with severe pain and his neck and RIGHT upper extremity.    Patient has been evaluated as follows:  Seen in the [facility] ER on 11/03/2018 by Vanessa Gans, NP. Notes reviewed. Ultrasound imaging of the right upper extremity was negative for DVT.  Pain felt to be secondary to overuse and repetitive motion.  He was prescribed a course of prednisone, cyclobenzaprine, and Norco.  He was advised to follow-up orthopedics in 1 week if not improved.   Seen in the [facility] ER on 11/13/2018 by Earleen Newport, [place]. Notes reviewed.  Pain noted to have improved some in the shoulder with the SMR medication, however he noted continued pain in his RFA, with acute pain in his finger; worse in digits 1-3. Pain worse in the morning hours. Pain improved when upper extremity in the prescribed sling. Patient had not been seen in consult by orthopedics at this point citing that he "had not gotten around to it". Diagnostic plain films of the cervical spine were ordered and found to be negative. Patient was prescribed a course of prednisone and metaxolone. He was once again encouraged to follow up  with orthopedics.    Seen for initial consult on 11/20/2018 by Dr. Thornton Park (orthopedics). Notes reviewed. Presenting history gave rise to concern for cervical radiculopathy vs. localized nerve compression leading to CTS. Orders were placed for EMG study and cervical MRI. Patient prescribed a course of methylprednisolone. Patient put out of work, and was scheduled to follow up with orthopedics after his EMG and MRI.    EMG testing done on 11/30/2018 that was negative for median or ulnar mononeuropathy. No active cervical cervical neuropathy on the RIGHT.   Patient presents to [facility] today with an acute symptom exacerbation. He notes that the first three digits of his RIGHT hand are more painful, swollen, and are now discolored. Patient states, "my fingers feel cold to me. They hurt so bad". Clinic staff unable to obtain pulse oximetry reading in digits 1-3, however experience no difficulties with pulse oximetry reading on digits 4 and 5. He adds that symptoms have been progressive, starting initially with a radial distribution and progressing towards the ulnar side of his hand. Patient notes numbness in his 4th digit today. Patient describes a "pulling" sensation with full extension of his middle finger.Patient has been seen by orthopedics, however states that he is frustrated because they told him that "nothing was wrong". Of note, patient has neglected to have ordered MRI imagine of his neck citing the fact that he could not afford it.   Kelly Brooks  presents anxious today. He notes that he experiences palpitations at night when he is  in bed. He states, "I know something is wrong, but I don't know what. I really worries me to the point of me having a panic attack almost every night".    Past Medical History:  Diagnosis Date   History of stomach ulcers     History reviewed. No pertinent surgical history.  History reviewed. No pertinent family history.  Social History   Socioeconomic  History   Marital status: Single    Spouse name: Not on file   Number of children: Not on file   Years of education: Not on file   Highest education level: Not on file  Occupational History   Not on file  Social Needs   Financial resource strain: Not on file   Food insecurity    Worry: Not on file    Inability: Not on file   Transportation needs    Medical: Not on file    Non-medical: Not on file  Tobacco Use   Smoking status: Current Every Day Smoker    Packs/day: 1.00    Types: Cigarettes   Smokeless tobacco: Never Used  Substance and Sexual Activity   Alcohol use: No   Drug use: Not Currently   Sexual activity: Not on file  Lifestyle   Physical activity    Days per week: Not on file    Minutes per session: Not on file   Stress: Not on file  Relationships   Social connections    Talks on phone: Not on file    Gets together: Not on file    Attends religious service: Not on file    Active member of club or organization: Not on file    Attends meetings of clubs or organizations: Not on file    Relationship status: Not on file   Intimate partner violence    Fear of current or ex partner: Not on file    Emotionally abused: Not on file    Physically abused: Not on file    Forced sexual activity: Not on file  Other Topics Concern   Not on file  Social History Narrative   Not on file    There are no active problems to display for this patient.   Home Medications:    Current Meds  Medication Sig   HYDROcodone-acetaminophen (NORCO/VICODIN) 5-325 MG tablet Take 1 tablet by mouth every 4 (four) hours as needed for moderate pain.   metaxalone (SKELAXIN) 800 MG tablet Take 1 tablet (800 mg total) by mouth 3 (three) times daily.    Allergies:   Shellfish allergy  Review of Systems (ROS): Review of Systems  Constitutional: Negative for chills and fever.  Respiratory: Negative for cough and shortness of breath.   Cardiovascular: Positive for  palpitations (at night). Negative for chest pain.  Musculoskeletal: Negative for back pain and neck pain.       Pain and swelling in fingers of RIGHT hand  Skin: Positive for color change (digits 1-3 on RIGHT hand).  Neurological: Positive for numbness (fingers of RIGHT hand). Negative for dizziness and weakness.  Hematological: Negative for adenopathy.  Psychiatric/Behavioral: The patient is nervous/anxious.      Physical Exam:  Triage Vital Signs ED Triage Vitals  Enc Vitals Group     BP 12/05/18 1132 (!) 133/96     Pulse Rate 12/05/18 1132 82     Resp 12/05/18 1132 16     Temp 12/05/18 1132 98.7 F (37.1 C)     Temp Source 12/05/18 1132 Oral  SpO2 12/05/18 1132 99 %     Weight 12/05/18 1130 237 lb (107.5 kg)     Height 12/05/18 1130 _0  (1.778 m)     Head Circumference --      Peak Flow --      Pain Score 12/05/18 1129 8     Pain Loc --      Pain Edu? --      Excl. in [place]? --     Physical Exam  Constitutional: He is oriented to person, place, and time and well-developed, well-nourished, and in no distress.  HENT:  Head: Normocephalic and atraumatic.  Mouth/Throat: Oropharynx is clear and moist and mucous membranes are normal.  Neck: Normal range of motion. Neck supple. No spinous process tenderness and no muscular tenderness present.  Cardiovascular: Normal rate, regular rhythm, normal heart sounds and intact distal pulses. Exam reveals no gallop and no friction rub.  No murmur heard. Pulmonary/Chest: Effort normal and breath sounds normal. No respiratory distress. He has no wheezes. He has no rales.  Musculoskeletal:     Right hand: He exhibits decreased range of motion (AROM causes pain exacerbation. Able to make a fist. Pulling sensation increases with full extension of middle finger), tenderness (exquisite), decreased capillary refill and swelling. He exhibits normal two-point discrimination, no deformity and no laceration. Decreased sensation is not present in  the ulnar distribution, is not present in the medial distribution and is not present in the radial distribution. Normal strength noted.     Comments: Acute discoloration of digits 1-3 on RIGHT hand. See attached medical photograph from today's clinic visit.   Neurological: He is alert and oriented to person, place, and time.  Skin: Skin is warm and dry. No rash noted. No erythema.  Psychiatric: Mood, affect and judgment normal.  Nursing note and vitals reviewed.       Urgent Care Treatments / Results:   LABS: PLEASE NOTE: all labs that were ordered this encounter are listed, however only abnormal results are displayed. Labs Reviewed - No data to display  EKG: -None  RADIOLOGY: -None  PROCEDURES: Procedures  MEDICATIONS RECEIVED THIS VISIT: Medications - No data to display  PERTINENT CLINICAL COURSE NOTES/UPDATES: Clinical Course as of Dec 05 216  Tue Dec 05, 2018  1210 Spoke with Dr. Mack Guise (orthopedics) via secure messaging. Medical photograph from today's visit reviewed. Recommendations were to send patient to the ED for further evaluation of hand circulation.    [BG]  1223 Patient report called to ED charge nurse Santiago Glad, RN). Made aware of presenting complaints, assessment, past workup, and need for further evaluation in the ED. Aware that patient arriving via POV.   [BG]    Clinical Course User Index [BG] Karen Kitchens, NP   Initial Impression / Assessment and Plan / Urgent Care Course:    Kelly Brooks is a 33 y.o. male who presents to [facility] today with complaints of Circulatory Problem and Numbness  Pertinent labs & imaging results that were available during my care of the patient were personally reviewed by me and considered in my medical decision making (see lab/imaging section of note for values and interpretations).  Patient overall well appearing and in no acute distress today in clinic. Exam reveals discoloration of digits 1-3 on RIGHT hand.  Digits are swollen and tender to touch. Patient has been dealing with a myriad of symptoms in his RIGHT upper extremity for over a month. He has had negative US imaging and EMG  testing. Cervical MRI ordered, however patient has elected to defer due to ability to pay for more testing at this time. Given presenting symptoms and physical exam, there is concern for microvascular occlusion in the setting of progressive discoloration, paraesthesias, and  increased pain. Differential also includes secondary Reynauds phenomenon (secondary Reynauds). Spoke with orthopedist who recently saw patient in consult. Recommendations were to send patient to the ED for further evaluation of hand circulation.   Discussed plan of care with patient and he is in agreement. Patient given option of where he would like to go for care, with the preference being [place], [place], or [facility] due to the availability of hand specialists. Patient electing to go to [facility]. Patient to leave [facility] today and present for further evaluation in the [facility] emergency department. Report called to charge nurse.     Final Clinical Impressions(s) / Urgent Care Diagnoses:   Final diagnoses:  Discoloration of skin of finger  Pain of finger of right hand  Paresthesia of finger    New Prescriptions:  No orders of the defined types were placed in this encounter.   Controlled Substance Prescriptions:  [place] Controlled Substance Registry consulted? Not Applicable  NOTE: This note was prepared using Dragon dictation software along with smaller phrase technology. Despite my best ability to proofread, there is the potential that transcriptional errors may still occur from this process, and are completely unintentional.     Karen Kitchens, NP 12/06/18 0222

## 2018-12-29 ENCOUNTER — Other Ambulatory Visit
Admission: RE | Admit: 2018-12-29 | Discharge: 2018-12-29 | Disposition: A | Discharge status: Home / Self Care | Payer: HRSA Program | Source: Ambulatory Visit | Attending: Vascular Surgery | Admitting: Vascular Surgery

## 2018-12-29 ENCOUNTER — Encounter (INDEPENDENT_AMBULATORY_CARE_PROVIDER_SITE_OTHER): Payer: Self-pay | Admitting: Vascular Surgery

## 2018-12-29 ENCOUNTER — Ambulatory Visit (INDEPENDENT_AMBULATORY_CARE_PROVIDER_SITE_OTHER): Payer: Self-pay

## 2018-12-29 ENCOUNTER — Ambulatory Visit (INDEPENDENT_AMBULATORY_CARE_PROVIDER_SITE_OTHER): Payer: Self-pay | Admitting: Vascular Surgery

## 2018-12-29 ENCOUNTER — Other Ambulatory Visit: Payer: Self-pay

## 2018-12-29 ENCOUNTER — Other Ambulatory Visit (INDEPENDENT_AMBULATORY_CARE_PROVIDER_SITE_OTHER): Payer: Self-pay | Admitting: Vascular Surgery

## 2018-12-29 ENCOUNTER — Telehealth (INDEPENDENT_AMBULATORY_CARE_PROVIDER_SITE_OTHER): Payer: Self-pay

## 2018-12-29 ENCOUNTER — Encounter (INDEPENDENT_AMBULATORY_CARE_PROVIDER_SITE_OTHER): Payer: Self-pay

## 2018-12-29 ENCOUNTER — Other Ambulatory Visit (INDEPENDENT_AMBULATORY_CARE_PROVIDER_SITE_OTHER): Payer: Self-pay | Admitting: Nurse Practitioner

## 2018-12-29 DIAGNOSIS — Z1159 Encounter for screening for other viral diseases: Secondary | ICD-10-CM | POA: Insufficient documentation

## 2018-12-29 DIAGNOSIS — Z01812 Encounter for preprocedural laboratory examination: Secondary | ICD-10-CM | POA: Insufficient documentation

## 2018-12-29 DIAGNOSIS — I96 Gangrene, not elsewhere classified: Secondary | ICD-10-CM

## 2018-12-29 DIAGNOSIS — Z8781 Personal history of (healed) traumatic fracture: Secondary | ICD-10-CM

## 2018-12-29 DIAGNOSIS — F17200 Nicotine dependence, unspecified, uncomplicated: Secondary | ICD-10-CM

## 2018-12-29 DIAGNOSIS — F17210 Nicotine dependence, cigarettes, uncomplicated: Secondary | ICD-10-CM

## 2018-12-29 LAB — SARS CORONAVIRUS 2 (TAT 6-24 HRS): SARS Coronavirus 2: NEGATIVE

## 2018-12-29 NOTE — Patient Instructions (Signed)
Gangrene Gangrene is a medical condition that is caused by a lack of blood supply to an area of your body. Oxygen travels through blood, so less blood means less oxygen. Without oxygen, body tissue will start to die. Gangrene can affect many parts of the body. It is most common on the skin (external gangrene) and on your arms and legs, but it can also affect internal body parts (internal gangrene). Gangrene requires emergency medical treatment. What are the causes? Any condition that cuts off blood supply can cause gangrene. Common causes include:  Injuries.  Blood vessel diseases.  Diabetes.  Infections. What increases the risk? You may be at increased risk for gangrene if you have recently had surgery, or if you have:  A serious injury.  Diabetes.  A weak disease-fighting system (immune system).  Narrowing of your arteries due to plaque buildup (arteriosclerosis).  An immune system disease that causes your arteries to tighten (Raynaud's disease).  A history of IV drug use. What are the signs or symptoms? Symptoms depend on which part of your body is affected. If you have external gangrene, you may have:  Severe pain, followed by loss of feeling.  Redness and swelling.  Crackling sounds when you press on a swollen area of skin.  A wound with bad-smelling drainage.  Changes in the color of your skin. It may turn red, blue, black, or white.  Fever and chills. If you have internal gangrene, you may have:  Fever and chills.  Confusion.  Dizziness.  Severe pain.  Rapid heartbeat and breathing.  Loss of appetite.  Nausea or vomiting. How is this diagnosed? This condition may be diagnosed based on:  Your symptoms and medical history.  A physical exam.  X-rays of your blood vessels after injecting dye into your blood vessels (arteriogram).  Blood tests to check for infection.  Imaging tests such as a CT scan or MRI.  Testing a sample of wound drainage for  bacteria (culture).  Testing a tissue sample for cell death (biopsy).  Surgery to look for gangrene inside your body. How is this treated? Treatment for gangrene depends on the cause and the area of your body that is affected. Gangrene is usually treated in the hospital. It is very important to start treatment early, before gangrene spreads. Treatment may include:  High doses of IV antibiotic medicine, for gangrene caused by infection.  Surgery. Surgical options may include: ? Debridement. This is surgery to remove dead tissue. You may need to have debridement several times. ? Bypass or angioplasty. These surgeries improve blood flow to an affected area. ? Amputation. This is surgery to remove a body part. This may be necessary in very severe cases.  Oxygen therapy. This involves treatment in a chamber designed to provide high levels of oxygen (hyperbaric oxygen therapy). It can improve the oxygen supply to an affected area.  Supportive care to keep the rest of your body healthy. This may include: ? IV fluids and nutrients. ? Pain medicines. ? Blood thinners to prevent blood clots. Follow these instructions at home: Skin and wound care  Clean any cuts or scratches with a germ-killing (antiseptic) solution. Your health care provider may recommend certain over-the-counter antiseptics.  Cover any open wounds with a clean bandage. Change the bandage as often as needed to keep the wound clean. Make sure to wash your hands before touching your bandage.  Check wounds every day for signs of infection, such as: ? Redness, swelling, or pain. ? Fluid or   blood. ? Warmth. ? Pus or a bad smell.  If you have diabetes or another blood vessel disease, check your feet every day for signs of injury or infection. You may be at higher risk for gangrene. Lifestyle  Do not use any products that contain nicotine or tobacco, such as cigarettes and e-cigarettes. These can delay wound healing. If you need  help quitting, ask your health care provider.  Limit alcohol intake to no more than 1 drink a day for women (no drinks if you are pregnant) and 2 drinks a day for men. One drink equals 12 oz of beer, 5 oz of wine, or 1 oz of hard liquor.  Eat a healthy diet and maintain a healthy weight.  Get some exercise on most days of the week. Ask your health care provider what forms of exercise may be best for you. General instructions  Keep all follow-up visits as told by your health care provider. This is important. Medicines  Take over-the-counter and prescription medicines only as told by your health care provider.  If you were prescribed an antibiotic medicine, take it as told by your health care provider. Do not stop taking the antibiotic even if you start to feel better.  If you are taking blood thinners: ? Talk with your health care provider before you take any medicines that contain aspirin or NSAIDs. These medicines increase your risk for dangerous bleeding. ? Take your medicine exactly as told, at the same time every day. ? Avoid activities that could cause injury or bruising, and follow instructions about how to prevent falls. ? Wear a medical alert bracelet or carry a card that lists what medicines you take. Contact a health care provider if:  You have a wound or sore that is not healing. Get help right away if:  You have a wound or sore that shows signs of infection.  An area of your skin turns white, red, blue, or black.  You have rapidly worsening pain at the site of a skin infection or wound.  You experience numbness at the site of a skin infection or wound.  You have unexplained: ? Fever. ? Chills. ? Confusion. ? Fainting. Summary  Gangrene is a medical condition that is caused by a lack of blood supply to an area of your body.  It is most common on the skin (external gangrene), but it can also affect internal body parts (internal gangrene).  Injuries and blood  vessel diseases are common causes of gangrene.  Gangrene requires emergency medical treatment. Treatment may include hospitalization, antibiotics, or surgery. This information is not intended to replace advice given to you by your health care provider. Make sure you discuss any questions you have with your health care provider. Document Released: 04/08/2004 Document Revised: 05/20/2017 Document Reviewed: 03/15/2017 Elsevier Patient Education  2020 [employer].

## 2018-12-29 NOTE — Telephone Encounter (Signed)
Patient was seen in office and scheduled for a arm angio with Dr. Lucky Cowboy on 01/03/2019 with a 9:45 am arrival time. Patient will do his Covid test today 12/29/2018. Zero visitor policy was discussed with the patient and his pre-procedure instructions were also given to him.

## 2018-12-29 NOTE — Assessment & Plan Note (Signed)
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  We discussed today that there are a large number of diagnoses that could be causing this.  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  He may have atheroembolism from a more proximal stenosis.  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  This could be an atheroembolic event or a cardiogenic embolic event as well.  We are going to get a duplex today but he will require an angiogram of the right upper extremity for further evaluation and potential treatment.  I have also prescribed him dual antiplatelet therapy and a statin agent to be ordered today.  We have had a long discussion about the serious nature of the situation and the risk of limb loss.  He voices his understanding.

## 2018-12-29 NOTE — Assessment & Plan Note (Signed)
Tobacco use is a major atherosclerotic risk factor and may be playing a significant contributing role.  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  We discussed tobacco cessation is of great importance.  He voices his understanding.

## 2018-12-29 NOTE — Progress Notes (Signed)
Patient ID: Kelly DiamondJustin D Crist, male   DOB: Jun 29, 1985, 33 y.o.   MRN: [id number]  Chief Complaint  Patient presents with  . New Patient (Initial Visit)    discoloration and swelling right hand    HPI Kelly Brooks is a 33 y.o. male.  Patient present for evaluation of pain and discoloration of multiple fingers on the right hand.  This started about 2 to 3 months ago without a clear inciting event or causative factor.  He has had a previous collarbone fracture on the right side many years ago.  He does smoke.  We discussed the importance of smoking cessation today and he understands.  Initially, it was all 5 fingers of the right hand.  Now the most prominently affected fingers are the second and third fingers and there appeared to be some dry gangrenous changes to the tips of the fingers.  This is associated with swelling in the hand.  Nothing is really made it better.  Pain medication has made the discomfort somewhat tolerable.  He denies any left arm symptoms.  No leg symptoms.  No fevers or chills.  Current Outpatient Medications  Medication Sig Dispense Refill  . amLODipine (NORVASC) 5 MG tablet Take 1 tablet (5 mg total) by mouth daily. 30 tablet 0  . HYDROcodone-acetaminophen (NORCO/VICODIN) 5-325 MG tablet Take 1 tablet by mouth every 4 (four) hours as needed for moderate pain. (Patient not taking: Reported on 12/29/2018) 20 tablet 0  . metaxalone (SKELAXIN) 800 MG tablet Take 1 tablet (800 mg total) by mouth 3 (three) times daily. (Patient not taking: Reported on 12/29/2018) 21 tablet 0   No current facility-administered medications for this visit.      Past Medical History:  Diagnosis Date  . History of stomach ulcers     No past surgical history on file.  Family History Brother with PAD No clotting disorders, bleeding disorders, or aneurysms  Social History Social History   Tobacco Use  . Smoking status: Current Every Day Smoker    Packs/day: 1.00    Types: Cigarettes   . Smokeless tobacco: Never Used  Substance Use Topics  . Alcohol use: No  . Drug use: Not Currently     Allergies  Allergen Reactions  . Shellfish Allergy Hives        REVIEW OF SYSTEMS (Negative unless checked)  Constitutional: [] Weight loss  [] Fever  [] Chills Cardiac: [] Chest pain   [] Chest pressure   [] Palpitations   [] Shortness of breath when laying flat   [] Shortness of breath at rest   [] Shortness of breath with exertion. Vascular:  [] Pain in legs with walking   [] Pain in legs at rest   [] Pain in legs when laying flat   [] Claudication   [] Pain in feet when walking  [] Pain in feet at rest  [] Pain in feet when laying flat   [] History of DVT   [] Phlebitis   [] Swelling in legs   [] Varicose veins   [] Non-healing ulcers Pulmonary:   [] Uses home oxygen   [] Productive cough   [] Hemoptysis   [] Wheeze  [] COPD   [] Asthma Neurologic:  [] Dizziness  [] Blackouts   [] Seizures   [] History of stroke   [] History of TIA  [] Aphasia   [] Temporary blindness   [] Dysphagia   [x] Weakness or numbness in arms   [] Weakness or numbness in legs Musculoskeletal:  [] Arthritis   [] Joint swelling   [] Joint pain   [] Low back pain Hematologic:  [] Easy bruising  [] Easy bleeding   [] Hypercoagulable state   []   Anemic  [] Hepatitis  Gastrointestinal:  [] Blood in stool   [] Vomiting blood  [] Gastroesophageal reflux/heartburn   [] Difficulty swallowing   Genitourinary:  [] Chronic kidney disease   [] Difficult urination  [] Frequent urination  [] Burning with urination   [] Blood in urine Skin:  [] Rashes   [] Ulcers   [] Wounds Psychological:  [] History of anxiety   []  History of major depression.  Physical Exam BP (!) 145/91   Pulse 83   Resp 16   Ht 5\' 10"  (1.778 m)   Wt 232 lb (105.2 kg)   BMI 33.29 kg/m   Gen:  WD/WN, NAD Head: [place]/AT, No temporalis wasting.  Ear/Nose/Throat: Hearing grossly intact, nares w/o erythema or drainage, oropharynx w/o Erythema/Exudate Eyes: Sclera non-icteric, conjunctiva clear Neck:  Trachea midline.  No JVD.  Pulmonary:  Good air movement, no use of accessory muscles.  Cardiac: RRR, normal S1, S2. Vascular:  Vessel Right Left  Radial 1+ Palpable 2+ Palpable                                   Musculoskeletal: M/S 5/5 throughout.  Right hand has moderate edema.  Right hand is also weak with decreased grip strength.  The second and third fingers have dry gangrenous changes distally.  No open wounds currently. Neurologic:  Sensation grossly intact in extremities.  Symmetrical.  Speech is fluent. Motor exam as listed above. Psychiatric: Judgment intact, Mood & affect appropriate for pt's clinical situation. Dermatologic: No rashes or ulcers noted.  No cellulitis or open wounds.   Radiology No results found.  Labs No results found for this or any previous visit (from the past 2160 hour(s)).  Assessment/Plan:  Tobacco use disorder Tobacco use is a major atherosclerotic risk factor and may be playing a significant contributing role.  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  We discussed tobacco cessation is of great importance.  He voices his understanding.  Gangrene of finger of right hand ([place]) The patient has clear ischemic changes to multiple fingers on the right hand.  We discussed today that there are a large number of diagnoses that could be causing this.  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  He may have atheroembolism from a more proximal stenosis.  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  This could be an atheroembolic event or a cardiogenic embolic event as well.  We are going to get a duplex today but he will require an angiogram of the right upper extremity for further evaluation and potential treatment.  I have also prescribed him dual antiplatelet therapy and a statin agent to be ordered today.  We have had a long  discussion about the serious nature of the situation and the risk of limb loss.  He voices his understanding.     Leotis Pain 12/29/2018, 9:10 AM   This note was created with [facility] medical dictation system.  Any errors from dictation are unintentional.

## 2018-12-31 ENCOUNTER — Other Ambulatory Visit (INDEPENDENT_AMBULATORY_CARE_PROVIDER_SITE_OTHER): Payer: Self-pay | Admitting: Nurse Practitioner

## 2019-01-03 ENCOUNTER — Ambulatory Visit
Admission: RE | Admit: 2019-01-03 | Discharge: 2019-01-03 | Disposition: A | Discharge status: Home / Self Care | Payer: Self-pay | Attending: Vascular Surgery | Admitting: Vascular Surgery

## 2019-01-03 ENCOUNTER — Telehealth (INDEPENDENT_AMBULATORY_CARE_PROVIDER_SITE_OTHER): Payer: Self-pay

## 2019-01-03 ENCOUNTER — Other Ambulatory Visit: Payer: Self-pay

## 2019-01-03 ENCOUNTER — Encounter
Admission: RE | Disposition: A | Discharge status: Home / Self Care | Payer: Self-pay | Source: Home / Self Care | Attending: Vascular Surgery

## 2019-01-03 DIAGNOSIS — I96 Gangrene, not elsewhere classified: Secondary | ICD-10-CM | POA: Insufficient documentation

## 2019-01-03 DIAGNOSIS — I998 Other disorder of circulatory system: Secondary | ICD-10-CM

## 2019-01-03 DIAGNOSIS — I742 Embolism and thrombosis of arteries of the upper extremities: Secondary | ICD-10-CM

## 2019-01-03 DIAGNOSIS — F17210 Nicotine dependence, cigarettes, uncomplicated: Secondary | ICD-10-CM | POA: Insufficient documentation

## 2019-01-03 HISTORY — PX: UPPER EXTREMITY ANGIOGRAPHY: CATH118270

## 2019-01-03 LAB — CREATININE, SERUM
Creatinine, Ser: 1.15 mg/dL (ref 0.61–1.24)
GFR calc Af Amer: >60 mL/min (ref >60)
GFR calc non Af Amer: >60 mL/min (ref >60)

## 2019-01-03 LAB — BUN: BUN: 15 mg/dL (ref 6–20)

## 2019-01-03 SURGERY — UPPER EXTREMITY ANGIOGRAPHY
Anesthesia: Moderate Sedation | Laterality: Right

## 2019-01-03 MED ORDER — SODIUM CHLORIDE (PF) 0.9 % IJ SOLN
INTRAMUSCULAR | Status: AC
  Filled 2019-01-03: qty 50

## 2019-01-03 MED ORDER — ONDANSETRON HCL 4 MG/2ML IJ SOLN: 4.0000 mg | Freq: Four times a day (QID) | INTRAMUSCULAR | Status: DC | PRN

## 2019-01-03 MED ORDER — CEFAZOLIN SODIUM-DEXTROSE 2-4 GM/100ML-% IV SOLN
2.0000 g | Freq: Once | INTRAVENOUS | Status: AC
  Administered 2019-01-03: 2 g via INTRAVENOUS

## 2019-01-03 MED ORDER — HEPARIN SODIUM (PORCINE) 1000 UNIT/ML IJ SOLN
INTRAMUSCULAR | Status: DC | PRN
  Administered 2019-01-03: 5000 [IU] via INTRAVENOUS

## 2019-01-03 MED ORDER — MIDAZOLAM HCL 2 MG/2ML IJ SOLN
INTRAMUSCULAR | Status: DC | PRN
  Administered 2019-01-03: 1 mg via INTRAVENOUS
  Administered 2019-01-03: 2 mg via INTRAVENOUS
  Administered 2019-01-03 (×2): 1 mg via INTRAVENOUS
  Administered 2019-01-03 (×2): 2 mg via INTRAVENOUS

## 2019-01-03 MED ORDER — HYDROCODONE-ACETAMINOPHEN 5-325 MG PO TABS: 1.0000 | ORAL_TABLET | ORAL | 0 refills | Status: DC | PRN

## 2019-01-03 MED ORDER — ATORVASTATIN CALCIUM 10 MG PO TABS: 10.0000 mg | ORAL_TABLET | Freq: Every day | ORAL | 11 refills | Status: DC

## 2019-01-03 MED ORDER — APIXABAN 5 MG PO TABS
5.0000 mg | ORAL_TABLET | Freq: Once | ORAL | Status: AC
  Administered 2019-01-03: 14:00:00 5 mg via ORAL
  Filled 2019-01-03: qty 1

## 2019-01-03 MED ORDER — DIPHENHYDRAMINE HCL 50 MG/ML IJ SOLN
50.0000 mg | Freq: Once | INTRAMUSCULAR | Status: AC | PRN
  Administered 2019-01-03: 12:00:00 50 mg via INTRAVENOUS

## 2019-01-03 MED ORDER — FAMOTIDINE 20 MG PO TABS
40.0000 mg | ORAL_TABLET | Freq: Once | ORAL | Status: AC | PRN
  Administered 2019-01-03: 11:00:00 40 mg via ORAL

## 2019-01-03 MED ORDER — SODIUM CHLORIDE 0.9 % IV SOLN
INTRAVENOUS | Status: DC
  Administered 2019-01-03: 11:00:00 via INTRAVENOUS

## 2019-01-03 MED ORDER — METHYLPREDNISOLONE SODIUM SUCC 125 MG IJ SOLR
125.0000 mg | Freq: Once | INTRAMUSCULAR | Status: AC | PRN
  Administered 2019-01-03: 11:00:00 125 mg via INTRAVENOUS

## 2019-01-03 MED ORDER — IODIXANOL 320 MG/ML IV SOLN
INTRAVENOUS | Status: DC | PRN
  Administered 2019-01-03: 115 mL via INTRA_ARTERIAL

## 2019-01-03 MED ORDER — DIPHENHYDRAMINE HCL 50 MG/ML IJ SOLN
INTRAMUSCULAR | Status: AC
  Administered 2019-01-03: 50 mg via INTRAVENOUS
  Filled 2019-01-03: qty 1

## 2019-01-03 MED ORDER — ASPIRIN EC 81 MG PO TBEC: 81.0000 mg | DELAYED_RELEASE_TABLET | Freq: Every day | ORAL | 2 refills | Status: AC

## 2019-01-03 MED ORDER — APIXABAN 5 MG PO TABS: 5.0000 mg | ORAL_TABLET | Freq: Two times a day (BID) | ORAL | 2 refills | Status: DC

## 2019-01-03 MED ORDER — NITROGLYCERIN 5 MG/ML IV SOLN
INTRAVENOUS | Status: AC
  Filled 2019-01-03: qty 10

## 2019-01-03 MED ORDER — FENTANYL CITRATE (PF) 100 MCG/2ML IJ SOLN
INTRAMUSCULAR | Status: AC
  Filled 2019-01-03: qty 2

## 2019-01-03 MED ORDER — HEPARIN SODIUM (PORCINE) 1000 UNIT/ML IJ SOLN
INTRAMUSCULAR | Status: AC
  Filled 2019-01-03: qty 1

## 2019-01-03 MED ORDER — METHYLPREDNISOLONE SODIUM SUCC 125 MG IJ SOLR
INTRAMUSCULAR | Status: AC
  Administered 2019-01-03: 125 mg via INTRAVENOUS
  Filled 2019-01-03: qty 2

## 2019-01-03 MED ORDER — ALTEPLASE 2 MG IJ SOLR
INTRAMUSCULAR | Status: DC | PRN
  Administered 2019-01-03: 6 mg

## 2019-01-03 MED ORDER — NITROGLYCERIN 1 MG/10 ML FOR IR/CATH LAB
INTRA_ARTERIAL | Status: DC | PRN
  Administered 2019-01-03: 750 ug

## 2019-01-03 MED ORDER — CEFAZOLIN SODIUM-DEXTROSE 2-4 GM/100ML-% IV SOLN
INTRAVENOUS | Status: AC
  Filled 2019-01-03: qty 100

## 2019-01-03 MED ORDER — HYDROMORPHONE HCL 1 MG/ML IJ SOLN
1.0000 mg | Freq: Once | INTRAMUSCULAR | Status: AC | PRN
  Administered 2019-01-03: 14:00:00 0.5 mg via INTRAVENOUS

## 2019-01-03 MED ORDER — MIDAZOLAM HCL 2 MG/ML PO SYRP: 8.0000 mg | ORAL_SOLUTION | Freq: Once | ORAL | Status: DC | PRN

## 2019-01-03 MED ORDER — FENTANYL CITRATE (PF) 100 MCG/2ML IJ SOLN
INTRAMUSCULAR | Status: DC | PRN
  Administered 2019-01-03 (×3): 25 ug via INTRAVENOUS
  Administered 2019-01-03: 50 ug via INTRAVENOUS
  Administered 2019-01-03: 25 ug via INTRAVENOUS
  Administered 2019-01-03: 50 ug via INTRAVENOUS

## 2019-01-03 MED ORDER — MIDAZOLAM HCL 5 MG/5ML IJ SOLN
INTRAMUSCULAR | Status: AC
  Filled 2019-01-03: qty 5

## 2019-01-03 MED ORDER — HYDROMORPHONE HCL 1 MG/ML IJ SOLN
INTRAMUSCULAR | Status: AC
  Filled 2019-01-03: qty 0.5

## 2019-01-03 MED ORDER — FAMOTIDINE 20 MG PO TABS
ORAL_TABLET | ORAL | Status: AC
  Administered 2019-01-03: 40 mg via ORAL
  Filled 2019-01-03: qty 2

## 2019-01-03 SURGICAL SUPPLY — 23 items
BALLN LUTONIX 018 4X150X130 (BALLOONS) ×3
BALLN LUTONIX 018 5X150X130 (BALLOONS) ×3
BALLN ULTRVRSE 3X80X150 (BALLOONS) ×2
BALLN ULTRVRSE 3X80X150 OTW (BALLOONS) ×1
BALLOON LUTONIX 018 4X150X130 (BALLOONS) ×1 IMPLANT
BALLOON LUTONIX 018 5X150X130 (BALLOONS) ×1 IMPLANT
BALLOON ULTRVRSE 3X80X150 OTW (BALLOONS) ×1 IMPLANT
CANISTER PENUMBRA ENGINE (MISCELLANEOUS) ×3 IMPLANT
CATH ANGIO 5F 100CM .035 PIG (CATHETERS) ×3 IMPLANT
CATH BEACON 5 .035 100 H1 TIP (CATHETERS) ×3 IMPLANT
CATH INDIGO CAT6 KIT (CATHETERS) ×3 IMPLANT
COVER PROBE U/S 5X48 (MISCELLANEOUS) ×3 IMPLANT
DEVICE PRESTO INFLATION (MISCELLANEOUS) ×3 IMPLANT
DEVICE STARCLOSE SE CLOSURE (Vascular Products) ×3 IMPLANT
PACK ANGIOGRAPHY (CUSTOM PROCEDURE TRAY) ×3 IMPLANT
SHEATH BRITE TIP 5FRX11 (SHEATH) ×3 IMPLANT
SHEATH GUIDING CAROTID 6FRX90 (SHEATH) ×3 IMPLANT
SYR MEDRAD MARK 7 150ML (SYRINGE) ×3 IMPLANT
TUBING CONTRAST HIGH PRESS 72 (TUBING) ×3 IMPLANT
WIRE AQUATRACK .035X260CM (WIRE) ×3 IMPLANT
WIRE G V18X300CM (WIRE) ×3 IMPLANT
WIRE J 3MM .035X145CM (WIRE) ×3 IMPLANT
WIRE MAGIC TORQUE 260C (WIRE) ×3 IMPLANT

## 2019-01-03 NOTE — Telephone Encounter (Signed)
Patient was advised to keep his procedure appt at the hospital for an angio with Dr. Lucky Cowboy. See notes below.

## 2019-01-03 NOTE — Telephone Encounter (Signed)
Patient is scheduled today for a upper extremity angio with Dr. Lucky Cowboy. The patient left a message stating that the tip of his finger has popped, the drainage of pus has cleared the blackness from the tip of his finger and now it looks better. Patient wanted to let you know this.

## 2019-01-03 NOTE — H&P (Signed)
[facility] History & Physical Update  The patient was interviewed and re-examined.  The patient's previous History and Physical has been reviewed and is unchanged.  There is no change in the plan of care. We plan to proceed with the scheduled procedure.  Leotis Pain, MD  01/03/2019, 11:00 AM

## 2019-01-03 NOTE — Progress Notes (Signed)
Provided patient's significant other Kelly KitchenAnguilla Brooks) with discharge instructions, patient's prescription, and patient's belongings. Patient and significant other stated no additional questions at this time.

## 2019-01-03 NOTE — Discharge Instructions (Signed)

## 2019-01-03 NOTE — Op Note (Signed)
OPERATIVE REPORT   PREOPERATIVE DIAGNOSIS: 1.  Right upper extremity ischemia with gangrenous changes to the fingers  POSTOPERATIVE DIAGNOSIS: Same as above  PROCEDURE PERFORMED: 1. Ultrasound guidance vascular access to left femoral artery. 2. Catheter placement to right radial radial artery  from right femoral approach. 3. Thoracic aortogram and selective right upper extremity angiogram 4.  Catheter directed thrombolytic therapy using 6 mg of TPA delivered in the right brachial artery 5.  Mechanical thrombectomy to the right brachial and radial artery with the penumbra cat 6 device 6.  Percutaneous transluminal angioplasty of the right brachial artery with 4 and 5 mm diameter Lutonix drug-coated angioplasty balloons 7.  Percutaneous transluminal angioplasty of the right radial artery with 3 mm diameter angioplasty balloon 8.  Star close closure device left femoral artery   SURGEON: Algernon Huxley, MD  ANESTHESIA: Local with moderate conscious sedation for 70 minutes using 8 mg of Versed and 200 Mcg of Fentanyl  BLOOD LOSS: 300 cc  FLUOROSCOPY TIME:16 minutes  INDICATION FOR PROCEDURE: This is a 33 Brooks who presented to our office with gangrenous changes to fingers 2 and 3 with clear signs of right upper extremity ischemia. The patient's symptoms have been going on for 2 to 3 months and noninvasive studies suggested occlusion of the right brachial artery.  At this point, the patient needs revascularization, angiogram of the left upper extremity is indicated. Risks and benefits are discussed. Informed consent was obtained.  DESCRIPTION OF PROCEDURE: The patient was brought to the vascular suite. Moderate conscious sedation was administered during a face to face encounter with the patient throughout the procedure with my supervision of the RN administering medicines and monitoring the patient's vital signs, pulse oximetry, telemetry and mental status throughout  from the start of the procedure until the patient was taken to the recovery room.  Groins were shaved and prepped and sterile surgical field was created. The left femoral head was localized with fluoroscopy and the right femoral artery was then visualized with ultrasound and found to be widely patent. It was then accessed under direct ultrasound guidance without difficulty with a Seldinger needle and a permanent image was recorded. A J-wire and 5-French sheath were then placed. Pigtail catheter was placed into the ascending aorta and a thoracic aortogram was then performed in the LAO projection. This demonstrated normal origins to the great vessels without significant proximal stenoses and a normal configuration of the great vessels. The patient was given 5000 units of intravenous heparin and a headhunter catheter was used to selectively cannulate the innominate artery and advanced into the right subclavian artery without difficulty. This was then sequentially advanced to the brachial artery.  The right subclavian artery, axillary artery, and proximal portion of the brachial artery were all widely patent without focal stenosis, aneurysmal dilatation noted explain embolization, or other abnormal findings.  Image quality was fairly poor due to the patient's continuous motion.  In the distal brachial artery just proximal to the antecubital fossa there was an abrupt occlusion with what appeared to be thrombus in the brachial artery and the proximal portions of the radial and ulnar artery both the radial and ulnar arteries reconstituted beyond the proximal occlusions and were continuous to the hand where there was a patent palmar arch.  I then placed a Magic torque wire and a long 6 French sheath into the right brachial artery.  The penumbra cat 6 device was brought onto the field and 6 mg of TPA were delivered in the  right brachial artery to perform catheter directed thrombolytic therapy.  We then  exchanged for a V Kelly wire which we parked down the radial artery and then we began with treatment.  Multiple passes with the penumbra cat 6 device were performed to perform mechanical thrombectomy to the right brachial artery and the right radial artery.  Some thrombus was removed, but much of this was now chronic appearing and areas of significant stenosis were residual.  A 4 mm diameter by 15 cm length Lutonix drug-coated angioplasty balloon was then used to treat the brachial artery from the brachial bifurcation back up to the mid upper arm brachial artery.  This was inflated to 10 atm for 1 minute.  Mild improvement was seen, but the proximal portion of the radial artery remained occluded and a 3 mm diameter by 8 cm length angioplasty balloon was inflated to 8 atm for 1 minute in the right radial artery to treat this area.  Following this, there remained thrombus in the brachial artery in the proximal portion of the radial and ulnar arteries although it was improved.  More passes were made with the penumbra cat 6 device with mild improvement.  There remained a high-grade residual stenosis and thrombus in the distal brachial artery and a 5 mm diameter by 15 cm length Lutonix drug-coated angioplasty balloon was inflated to 8 atm for 1 minute in the right brachial artery.  Completion imaging showed some spasm but markedly improved flow in the brachial and radial arteries.  There was about a 30% residual stenosis in the distal brachial artery where the occlusive thrombus was and about a 30% residual stenosis in the proximal radial artery but the flow was now continuous into the hand and the ulnar artery fills retrograde.  Intra-arterial nitroglycerin was given to help treat the spasm and I felt at this point there was not much more we could do from an endovascular standpoint to improve his flow. The diagnostic catheter was removed. Oblique arteriogram was performed of the left femoral artery and StarClose closure  device deployed in the usual fashion with excellent hemostatic result. The patient tolerated the procedure well and was taken to the recovery room in stable condition.   Leotis Pain 01/03/2019 1:42 PM

## 2019-01-04 ENCOUNTER — Telehealth (INDEPENDENT_AMBULATORY_CARE_PROVIDER_SITE_OTHER): Payer: Self-pay

## 2019-01-08 ENCOUNTER — Encounter: Payer: Self-pay | Admitting: Vascular Surgery

## 2019-01-16 ENCOUNTER — Encounter (INDEPENDENT_AMBULATORY_CARE_PROVIDER_SITE_OTHER): Payer: Self-pay | Admitting: Vascular Surgery

## 2019-01-16 ENCOUNTER — Other Ambulatory Visit (INDEPENDENT_AMBULATORY_CARE_PROVIDER_SITE_OTHER): Payer: Self-pay | Admitting: Vascular Surgery

## 2019-01-16 ENCOUNTER — Other Ambulatory Visit: Payer: Self-pay

## 2019-01-16 ENCOUNTER — Ambulatory Visit (INDEPENDENT_AMBULATORY_CARE_PROVIDER_SITE_OTHER): Payer: Self-pay | Admitting: Vascular Surgery

## 2019-01-16 ENCOUNTER — Ambulatory Visit (INDEPENDENT_AMBULATORY_CARE_PROVIDER_SITE_OTHER): Payer: Self-pay

## 2019-01-16 VITALS — BP 123/86 | HR 83 | Resp 16 | Wt 231.0 lb

## 2019-01-16 DIAGNOSIS — Z9862 Peripheral vascular angioplasty status: Secondary | ICD-10-CM

## 2019-01-16 DIAGNOSIS — I998 Other disorder of circulatory system: Secondary | ICD-10-CM

## 2019-01-16 DIAGNOSIS — F17210 Nicotine dependence, cigarettes, uncomplicated: Secondary | ICD-10-CM

## 2019-01-16 DIAGNOSIS — F17200 Nicotine dependence, unspecified, uncomplicated: Secondary | ICD-10-CM

## 2019-01-16 DIAGNOSIS — I96 Gangrene, not elsewhere classified: Secondary | ICD-10-CM

## 2019-01-16 DIAGNOSIS — Z789 Other specified health status: Secondary | ICD-10-CM

## 2019-01-16 NOTE — Progress Notes (Signed)
MRN : [id number]  Kelly SaverJustin Devel Brooks is a 33 y.o. (1985/11/06) male who presents with chief complaint of  Chief Complaint  Patient presents with  . Follow-up    [facility] 2week post angio  .  History of Present Illness: Patient returns today in follow up of right upper extremity ischemia with gangrenous changes to the right second finger.  He had a percutaneous intervention about 2 weeks ago and initially the finger felt some better and he got some pus out of it.  His symptoms have really gone back to about where they were over the last week or so.  His access site is well-healed and he had no periprocedural complications.  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  The ulnar artery is being filled through a collateral.  Current Outpatient Medications  Medication Sig Dispense Refill  . apixaban (ELIQUIS) 5 MG TABS tablet Take 1 tablet (5 mg total) by mouth 2 (two) times daily. 60 tablet 2  . aspirin EC 81 MG tablet Take 1 tablet (81 mg total) by mouth daily. 150 tablet 2  . atorvastatin (LIPITOR) 10 MG tablet Take 1 tablet (10 mg total) by mouth daily. 30 tablet 11  . HYDROcodone-acetaminophen (NORCO/VICODIN) 5-325 MG tablet Take 1 tablet by mouth every 4 (four) hours as needed for moderate pain. 20 tablet 0  . ibuprofen (ADVIL) 200 MG tablet Take 400 mg by mouth every 6 (six) hours as needed.    Marland Kitchen. amLODipine (NORVASC) 5 MG tablet Take 1 tablet (5 mg total) by mouth daily. (Patient not taking: Reported on 01/03/2019) 30 tablet 0  . metaxalone (SKELAXIN) 800 MG tablet Take 1 tablet (800 mg total) by mouth 3 (three) times daily. (Patient not taking: Reported on 12/29/2018) 21 tablet 0   No current facility-administered medications for this visit.     Past Medical History:  Diagnosis Date  . History of stomach ulcers     Past Surgical History:  Procedure Laterality Date  . ulcer rupture    . UPPER EXTREMITY ANGIOGRAPHY  Right 01/03/2019   Procedure: UPPER EXTREMITY ANGIOGRAPHY;  Surgeon: Annice Needyew, Shelby Anderle S, MD;  Location: [facility] INVASIVE CV LAB;  Service: Cardiovascular;  Laterality: Right;    Family History Brother with PAD No clotting disorders, bleeding disorders, or aneurysms  Social History Social History        Tobacco Use  . Smoking status: Current Every Day Smoker    Packs/day: 1.00    Types: Cigarettes  . Smokeless tobacco: Never Used  Substance Use Topics  . Alcohol use: No  . Drug use: Not Currently         Allergies  Allergen Reactions  . Shellfish Allergy Hives        REVIEW OF SYSTEMS (Negative unless checked)  Constitutional: [] ?Weight loss  [] ?Fever  [] ?Chills Cardiac: [] ?Chest pain   [] ?Chest pressure   [] ?Palpitations   [] ?Shortness of breath when laying flat   [] ?Shortness of breath at rest   [] ?Shortness of breath with exertion. Vascular:  [] ?Pain in legs with walking   [] ?Pain in legs at rest   [] ?Pain in legs when laying flat   [] ?Claudication   [] ?Pain in feet when walking  [] ?Pain in feet at rest  [] ?Pain in feet when laying flat   [] ?History of DVT   [] ?Phlebitis   [] ?Swelling in legs   [] ?Varicose veins   [] ?Non-healing ulcers Pulmonary:   [] ?Uses home oxygen   [] ?  Productive cough   [] ?Hemoptysis   [] ?Wheeze  [] ?COPD   [] ?Asthma Neurologic:  [] ?Dizziness  [] ?Blackouts   [] ?Seizures   [] ?History of stroke   [] ?History of TIA  [] ?Aphasia   [] ?Temporary blindness   [] ?Dysphagia   [x] ?Weakness or numbness in arms   [] ?Weakness or numbness in legs Musculoskeletal:  [] ?Arthritis   [] ?Joint swelling   [] ?Joint pain   [] ?Low back pain Hematologic:  [] ?Easy bruising  [] ?Easy bleeding   [] ?Hypercoagulable state   [] ?Anemic  [] ?Hepatitis  Gastrointestinal:  [] ?Blood in stool   [] ?Vomiting blood  [] ?Gastroesophageal reflux/heartburn   [] ?Difficulty swallowing   Genitourinary:  [] ?Chronic kidney disease   [] ?Difficult urination  [] ?Frequent urination  [] ?Burning with  urination   [] ?Blood in urine Skin:  [] ?Rashes   [] ?Ulcers   [] ?Wounds Psychological:  [] ?History of anxiety   [] ? History of major depression.    Physical Examination  BP 123/86 (BP Location: Left Arm)   Pulse 83   Resp 16   Wt 231 lb (104.8 kg)   BMI 33.15 kg/m  Gen:  WD/WN, NAD Head: [place]/AT, No temporalis wasting. Ear/Nose/Throat: Hearing grossly intact, nares w/o erythema or drainage Eyes: Conjunctiva clear. Sclera non-icteric Neck: Supple.  Trachea midline Pulmonary:  Good air movement, no use of accessory muscles.  Cardiac: RRR, no JVD Vascular:  Vessel Right Left  Radial  not palpable  2+ palpable                       Musculoskeletal: M/S 5/5 throughout.  No deformity or atrophy. Mild RUE edema.  Gangrenous changes are present to the tip of the right second finger that appear relatively dry. Neurologic: Sensation grossly intact in extremities.  Symmetrical.  Speech is fluent.  Psychiatric: Judgment intact, Mood & affect appropriate for pt's clinical situation. Dermatologic: Gangrenous changes to the tip of the right second finger       Labs Recent Results (from the past 2160 hour(s))  SARS Coronavirus 2 (Performed in [facility] lab)     Status: None   Collection Time: 12/29/18  9:50 AM   Specimen: Nasal Swab  Result Value Ref Range   SARS Coronavirus 2 NEGATIVE NEGATIVE    Comment: (NOTE) SARS-CoV-2 target nucleic acids are NOT DETECTED. The SARS-CoV-2 RNA is generally detectable in upper and lower respiratory specimens during the acute phase of infection. Negative results do not preclude SARS-CoV-2 infection, do not rule out co-infections with other pathogens, and should not be used as the sole basis for treatment or other patient management decisions. Negative results must be combined with clinical observations, patient history, and epidemiological information. The expected result is Negative. Fact Sheet for Patients:  HairSlick.nohttps://www.fda.gov/media/138098/download Fact Sheet for Healthcare Providers: quierodirigir.comhttps://www.fda.gov/media/138095/download This test is not yet approved or cleared by the [place] FDA and  has been authorized for detection and/or diagnosis of SARS-CoV-2 by FDA under an Emergency Use Authorization (EUA). This EUA will remain  in effect (meaning this test can be used) for the duration of the COVID-19 declaration under Section 56 4(b)(1) of the Act, 21 U.S.C. section 360bbb-3(b)(1), unless the authorization is terminated or revoked sooner. Performed at [facility], [address] [place], [place] [address]   BUN     Status: None   Collection Time: 01/03/19 10:20 AM  Result Value Ref Range   BUN 15 6 - 20 mg/dL    Comment: Performed at [facility], [address] [place], [place] [address]  Creatinine,  serum     Status: None   Collection Time: 01/03/19 10:20 AM  Result Value Ref Range   Creatinine, Ser 1.15 0.61 - 1.24 mg/dL   GFR calc non Af Amer >60 >60 mL/min   GFR calc Af Amer >60 >60 mL/min    Comment: Performed at [facility], [address] [place], [place] [address]    Radiology Vas [place] Upper Extremity Arterial Duplex  Result Date: 12/29/2018 UPPER EXTREMITY DUPLEX STUDY Indications: 1st three digits painful with discoloration. History:     No injury per patient to right arm.  Performing Technologist: Concha Norway RVT  Examination Guidelines: A complete evaluation includes B-mode imaging, spectral Doppler, color Doppler, and power Doppler as needed of all accessible portions of each vessel. Bilateral testing is considered an integral part of a complete examination. Limited examinations for reoccurring indications may be performed as noted.  Right Doppler Findings: +----------+----------+----------+------+--------+ Site      PSV (cm/s)Waveform  PlaqueComments +----------+----------+----------+------+--------+ [id number]       triphasic                 +----------+----------+----------+------+--------+ Axillary  100       biphasic                 +----------+----------+----------+------+--------+ Brachial  82        monophasic               +----------+----------+----------+------+--------+ Radial    74        monophasic               +----------+----------+----------+------+--------+ Ulnar     22        monophasic               +----------+----------+----------+------+--------+  Summary:  Right: Acute appearing thrombus seen in the distal brachial artery        and proximal radial/ulnar artery with flow seen navigating        around and through obstruction. PPG's added and show reduced        flow in the 2nd and 3rd digits. *See table(s) above for measurements and observations. Electronically signed by Leotis Pain MD on 12/29/2018 at 12:28:48 PM.    Final     Assessment/Plan Tobacco use disorder Tobacco use is a major atherosclerotic risk factor and may be playing a significant contributing role.  We discussed tobacco cessation is of great importance.  He voices his understanding.  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  The ulnar artery is being filled through a collateral. Given the gangrenous changes to his finger and the clearly ischemic nature of the hand, this is a very worrisome situation.  Although the hand is much more tolerant of ischemia than the leg, he is still at high risk of amputation beyond just the fingertip which I think will be lost.  At this point, I think open surgical revascularization with an embolectomy and endarterectomy will be his best course of action.  I want to try to get that scheduled for the very near future.  I discussed the critical nature of the situation with the patient he voices his understanding.  We actually offered him tomorrow in the operating room but he was  unable to obtain transportation and would prefer to do this next week.    Leotis Pain, MD  01/16/2019 11:03 AM    This  note was created with Dragon medical transcription system.  Any errors from dictation are purely unintentional

## 2019-01-16 NOTE — Assessment & Plan Note (Addendum)
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  The ulnar artery is being filled through a collateral. Given the gangrenous changes to his finger and the clearly ischemic nature of the hand, this is a very worrisome situation.  Although the hand is much more tolerant of ischemia than the leg, he is still at high risk of amputation beyond just the fingertip which I think will be lost.  At this point, I think open surgical revascularization with an embolectomy and endarterectomy will be his best course of action.  I want to try to get that scheduled for the very near future.  I discussed the critical nature of the situation with the patient he voices his understanding.  We actually offered him tomorrow in the operating room but he was unable to obtain transportation and would prefer to do this next week.

## 2019-01-17 ENCOUNTER — Telehealth (INDEPENDENT_AMBULATORY_CARE_PROVIDER_SITE_OTHER): Payer: Self-pay

## 2019-01-17 NOTE — Telephone Encounter (Signed)
Spoke with the patient and he is now scheduled for surgery with Dr. Lucky Cowboy on 01/25/2019. Patient will do his pre-op and Covid testing on 01/22/2019 at 12:45 pm at the [facility]. Pre-surgical information will be mailed out to the patient.

## 2019-01-21 ENCOUNTER — Other Ambulatory Visit (INDEPENDENT_AMBULATORY_CARE_PROVIDER_SITE_OTHER): Payer: Self-pay | Admitting: Nurse Practitioner

## 2019-01-22 ENCOUNTER — Encounter
Admission: RE | Admit: 2019-01-22 | Discharge: 2019-01-22 | Disposition: A | Discharge status: Home / Self Care | Payer: Self-pay | Source: Ambulatory Visit | Attending: Vascular Surgery | Admitting: Vascular Surgery

## 2019-01-22 ENCOUNTER — Other Ambulatory Visit: Payer: Self-pay

## 2019-01-22 DIAGNOSIS — I498 Other specified cardiac arrhythmias: Secondary | ICD-10-CM | POA: Insufficient documentation

## 2019-01-22 DIAGNOSIS — Z01810 Encounter for preprocedural cardiovascular examination: Secondary | ICD-10-CM

## 2019-01-22 DIAGNOSIS — Z01818 Encounter for other preprocedural examination: Secondary | ICD-10-CM | POA: Insufficient documentation

## 2019-01-22 DIAGNOSIS — Z20828 Contact with and (suspected) exposure to other viral communicable diseases: Secondary | ICD-10-CM | POA: Insufficient documentation

## 2019-01-22 HISTORY — DX: Gastro-esophageal reflux disease without esophagitis: K21.9

## 2019-01-22 LAB — APTT: aPTT: 27 seconds (ref 24–36)

## 2019-01-22 LAB — CBC WITH DIFFERENTIAL/PLATELET
Abs Immature Granulocytes: 0.02 10*3/uL (ref 0.00–0.07)
Basophils Absolute: 0 10*3/uL (ref 0.0–0.1)
Basophils Relative: 0 %
Eosinophils Absolute: 0.1 10*3/uL (ref 0.0–0.5)
Eosinophils Relative: 2 %
HCT: 40.3 % (ref 39.0–52.0)
Hemoglobin: 13.8 g/dL (ref 13.0–17.0)
Immature Granulocytes: 0 %
Lymphocytes Relative: 24 %
Lymphs Abs: 1.6 10*3/uL (ref 0.7–4.0)
MCH: 31.7 pg (ref 26.0–34.0)
MCHC: 34.2 g/dL (ref 30.0–36.0)
MCV: 92.6 fL (ref 80.0–100.0)
Monocytes Absolute: 0.4 10*3/uL (ref 0.1–1.0)
Monocytes Relative: 6 %
Neutro Abs: 4.5 10*3/uL (ref 1.7–7.7)
Neutrophils Relative %: 68 %
Platelets: 350 10*3/uL (ref 150–400)
RBC: 4.35 MIL/uL (ref 4.22–5.81)
RDW: 13.2 % (ref 11.5–15.5)
WBC: 6.7 10*3/uL (ref 4.0–10.5)
nRBC: 0 % (ref 0.0–0.2)

## 2019-01-22 LAB — BASIC METABOLIC PANEL
Anion gap: 9 (ref 5–15)
BUN: 14 mg/dL (ref 6–20)
CO2: 24 mmol/L (ref 22–32)
Calcium: 9.1 mg/dL (ref 8.9–10.3)
Chloride: 105 mmol/L (ref 98–111)
Creatinine, Ser: 1.14 mg/dL (ref 0.61–1.24)
GFR calc Af Amer: >60 mL/min (ref >60)
GFR calc non Af Amer: >60 mL/min (ref >60)
Glucose, Bld: 97 mg/dL (ref 70–99)
Potassium: 4 mmol/L (ref 3.5–5.1)
Sodium: 138 mmol/L (ref 135–145)

## 2019-01-22 LAB — TYPE AND SCREEN
ABO/RH(D): B POS
Antibody Screen: NEGATIVE

## 2019-01-22 LAB — PROTIME-INR
INR: 1 (ref 0.8–1.2)
Prothrombin Time: 13.1 seconds (ref 11.4–15.2)

## 2019-01-22 LAB — SARS CORONAVIRUS 2 (TAT 6-24 HRS): SARS Coronavirus 2: NEGATIVE

## 2019-01-22 NOTE — Patient Instructions (Addendum)
Your procedure is scheduled on: 01-25-19 THURSDAY Report to Same Day Surgery 2nd floor medical mall [facility](Medical Mall Entrance-take elevator on left to 2nd floor.  Check in with surgery information desk.) To find out your arrival time please call [phone] between 1PM - 3PM [facility]  Remember: Instructions that are not followed completely may result in serious medical risk, up to and including death, or upon the discretion of your surgeon and anesthesiologist your surgery may need to be rescheduled.    _x___ 1. Do not eat food after midnight the night before your procedure. NO GUM OR CANDY AFTER MIDNIGHT. You may drink clear liquids up to 2 hours before you are scheduled to arrive at the hospital for your procedure.  Do not drink clear liquids within 2 hours of your scheduled arrival to the hospital.  Clear liquids include  --Water or Apple juice without pulp  --Clear carbohydrate beverage such as ClearFast or Gatorade  --Black Coffee or Clear Tea (No milk, no creamers, do not add anything to the coffee or Tea   ____Ensure clear carbohydrate drink on the way to the hospital for bariatric patients  ____Ensure clear carbohydrate drink 3 hours before surgery.     __x__ 2. No Alcohol for 24 hours before or after surgery.   __x__3. No Smoking or e-cigarettes for 24 prior to surgery.  Do not use any chewable tobacco products for at least 6 hour prior to surgery   ____  4. Bring all medications with you on the day of surgery if instructed.    __x__ 5. Notify your doctor if there is any change in your medical condition     (cold, fever, infections).    x___6. On the morning of surgery brush your teeth with toothpaste and water.  You may rinse your mouth with mouth wash if you wish.  Do not swallow any toothpaste or mouthwash.   Do not wear jewelry, make-up, hairpins, clips or nail polish.  Do not wear lotions, powders, or perfumes. You may wear deodorant.  Do not shave 48 hours  prior to surgery. Men may shave face and neck.  Do not bring valuables to the hospital.    [facility] is not responsible for any belongings or valuables.               Contacts, dentures or bridgework may not be worn into surgery.  Leave your suitcase in the car. After surgery it may be brought to your room.  For patients admitted to the hospital, discharge time is determined by your treatment team.  _  Patients discharged the day of surgery will not be allowed to drive home.  You will need someone to drive you home and stay with you the night of your procedure.    Please read over the following fact sheets that you were given:   [facility] Preparing for [facility]   ____ Take anti-hypertensive listed below, cardiac, seizure, asthma, anti-reflux and psychiatric medicines. These include:  1. NONE  2.  3.  4.  5.  6.  ____Fleets enema or Magnesium Citrate as directed.   _x___ Use CHG Soap or sage wipes as directed on instruction sheet   ____ Use inhalers on the day of surgery and bring to hospital day of surgery  ____ Stop Metformin and Janumet 2 days prior to surgery.    ____ Take 1/2 of usual insulin dose the night before surgery and none on the morning  surgery.   _x___  Follow recommendations from Cardiologist, Pulmonologist or PCP regarding stopping Aspirin, Coumadin, Plavix ,Eliquis, Effient, or Pradaxa, and Pletal-STOP YOUR ELIQUIS 2 DAYS PRIOR TO SURGERY (LAST DOSE 01-22-19) AND CONTINUE ASPIRIN. DO NOT TAKE ASPIRIN AM OF SURGERY  X____Stop Anti-inflammatories such as Advil, Aleve, Ibuprofen, Motrin, Naproxen, Naprosyn, Goodies powders or aspirin products NOW-OK to take Tylenol    ____ Stop supplements until after surgery.    ____ Bring C-Pap to the hospital.

## 2019-01-22 NOTE — Telephone Encounter (Signed)
There was a question regarding stopping  his Aspirin and Eliquis. Per Arna Medici the patient is to stop his Eliquis 2 days prior to his surgery and continue the Aspirin. This information was given to the patient.

## 2019-01-23 NOTE — Pre-Procedure Instructions (Signed)
ekg showing st elevation. Messaged dr Rosey Bath and he wants clearance. Called laura at dr dews and informed her of this and let her know that she will have to set up clearance as pt does not have a pcp or a cardiologist

## 2019-01-24 ENCOUNTER — Telehealth: Payer: Self-pay

## 2019-01-24 ENCOUNTER — Telehealth (INDEPENDENT_AMBULATORY_CARE_PROVIDER_SITE_OTHER): Payer: Self-pay

## 2019-01-24 NOTE — Telephone Encounter (Signed)
I attempted to contact the patient to let him know he has an  appt with [facility] at [facility] on 02/01/2019 @ 11:40 for cardiac clearance. Patients surgery has been rescheduled to 02/08/2019.

## 2019-01-24 NOTE — Telephone Encounter (Signed)
Received request for card clearance from pre admit Dr. Lucky Cowboy on an unknown patient .    Given to [facility] RN to see if Dr. Saunders Revel is aware of this request as surgery is scheduled for 8/6 .

## 2019-01-24 NOTE — Telephone Encounter (Signed)
Scheduled 8/10 [facility] for Clearance appt

## 2019-01-24 NOTE — Telephone Encounter (Signed)
Request for clearance was received from Pre-Admission testing.  Stating Dr. Lucky Cowboy was needing cardiac clearance from Dr. Saunders Revel for an abnormal EKG. Patient was scheduled for a right brachial & radial endarterectomy on 01/25/19.  Faxed EKG was reviewed by Dr. Saunders Revel (dated 01/22/19). Per Dr. Saunders Revel, he feels the patient is having early repolarization and this is not an uncommon finding, however, if Dr. Lucky Cowboy needs formal cardiac clearance, then the patient's surgery will need to be postponed and the patient formally seen/ evaluated in our office.   I called and left messages at both pre-admit testing and Dr. Bunnie Domino office of the above information.  Will forward to scheduling pool to please call the patient for a new patient appt for clearance prior to his surgery that has been r/s to 8/13.

## 2019-01-24 NOTE — Telephone Encounter (Signed)
Kelly Brooks called to make office aware that patients surgery date has been postponed. Patient is now scheduled for 8/13 for clearance.

## 2019-01-24 NOTE — Telephone Encounter (Signed)
Patient called back and I let him know that he has an appt with Dr. Saunders Revel on 02/01/2019 at 11:40 am at [facility]. Patient understood the appt. Patient was also informed of the surgery being moved to 02/08/2019 as well.

## 2019-01-27 NOTE — Progress Notes (Addendum)
Cardiology Office Note  Date:  01/29/2019   ID:  Cong, Hightower [birth year], MRN [id number]  PCP:  Patient, No Pcp Per   Chief Complaint  Patient presents with  . Other    Referred by [facility] for Cardiac clearance , abnormal EKG. Meds reviewed verbally with patient.     HPI:  Mr. Kelly Brooks is a 33 year old gentleman with past medical history of smoker Right upper extremity ischemia with gangrenous changes to the fingers abnormal EKG. Patient was scheduled for a right brachial & radial endarterectomy  Presents for cardiac preoperative evaluation for surgery  Reports that in middle of May fell asleep 1 night hands above and behind his head Woke up 540 in the morning with right arm pain and numbness Symptoms persisted,  Seen by orthopedics then urgent care than hand specialist Finally referred to vascular surgery Angiogram procedure as detailed below   Procedure: 01/03/2019 2. Catheter placement to right radial radial artery  from right femoral approach. 3. Thoracic aortogram and selective right upper extremity angiogram 4.  Catheter directed thrombolytic therapy using 6 mg of TPA delivered in the right brachial artery 5.  Mechanical thrombectomy to the right brachial and radial artery with the penumbra cat 6 device 6.  Percutaneous transluminal angioplasty of the right brachial artery with 4 and 5 mm diameter Lutonix drug-coated angioplasty balloons 7.  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  The ulnar artery is being filled through a collateral.  He has remained on eliquis Was previously on Lipitor, not taking this at this time Ran out of his pain medication  Notes indicating he will need open surgical revascularization with an embolectomy and endarterectomy   EKG from  01/22/2019 NSR with repol abn  EKG on today's visit showing normal sinus rhythm with rate 60 bpm, repolarization abnormality noted   PMH:   has a past medical history of GERD (gastroesophageal reflux disease) and History of stomach ulcers.  PSH:    Past Surgical History:  Procedure Laterality Date  . ulcer rupture    . UPPER EXTREMITY ANGIOGRAPHY Right 01/03/2019   Procedure: UPPER EXTREMITY ANGIOGRAPHY;  Surgeon: Algernon Huxley, MD;  Location: [place] [facility];  Service: Cardiovascular;  Laterality: Right;    Current Outpatient Medications  Medication Sig Dispense Refill  . apixaban (ELIQUIS) 5 MG TABS tablet Take 1 tablet (5 mg total) by mouth 2 (two) times daily. 60 tablet 2  . aspirin EC 81 MG tablet Take 1 tablet (81 mg total) by mouth daily. 150 tablet 2  . ibuprofen (ADVIL) 200 MG tablet Take 400 mg by mouth every 6 (six) hours as needed.     No current facility-administered medications for this visit.      Allergies:   Shellfish allergy and Tape   Social History:  The patient  reports that he has been smoking cigarettes. He has a 15.00 pack-year smoking history. He has never used smokeless tobacco. He reports current alcohol use of about 2.0 standard drinks of alcohol per week. He reports previous drug use.   Family History:   family history is not on file.    Review of Systems: Review of Systems  Constitutional: Negative.   HENT: Negative.   Respiratory: Negative.   Cardiovascular: Negative.   Gastrointestinal: Negative.   Musculoskeletal: Negative.  Pain in the right hand second finger/index finger tip  Neurological: Negative.   Psychiatric/Behavioral: Negative.   All other systems reviewed and are negative.    PHYSICAL EXAM: VS:  BP 128/72 (BP Location: Left Arm, Patient Position: Sitting, Cuff Size: Normal)   Pulse 60   Ht 5\' 10"  (1.778 m)   Wt 232 lb (105.2 kg)   BMI 33.29 kg/m  , BMI Body mass index is 33.29 kg/m. GEN: Well nourished, well  developed, in no acute distress HEENT: normal Neck: no JVD, carotid bruits, or masses Cardiac: RRR; no murmurs, rubs, or gallops,no edema  Respiratory:  clear to auscultation bilaterally, normal work of breathing GI: soft, nontender, nondistended, + BS MS: no deformity or atrophy Wound noticed tip of right index finger Skin: warm and dry, no rash Neuro:  Strength and sensation are intact Psych: euthymic mood, full affect    Recent Labs: 01/22/2019: BUN 14; Creatinine, Ser 1.14; Hemoglobin 13.8; Platelets 350; Potassium 4.0; Sodium 138    Lipid Panel No results found for: CHOL, HDL, LDLCALC, TRIG    Wt Readings from Last 3 Encounters:  01/29/19 232 lb (105.2 kg)  01/16/19 231 lb (104.8 kg)  01/03/19 234 lb (106.1 kg)       ASSESSMENT AND PLAN:  Problem List Items Addressed This Visit    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   Tobacco use disorder    Other Visit Diagnoses    PAD (peripheral artery disease) (HCC)       Abnormal EKG       Relevant Orders   EKG 12-Lead      Preop cardiovascular evaluation Acceptable risk for surgery, no further cardiac testing needed EKG essentially normal, early repolarization benign finding Prior to onset of symptoms in his right arm in mid May he had been working 12 hours a day with no restrictions, no anginal symptoms  PAD Scheduled for surgery as detailed above We will discuss with vascular surgery pathology of his disease If felt to be from atherosclerosis, we will arrange lipid panel and continuation of his statin  Smoking We have encouraged him to continue to work on weaning his cigarettes and smoking cessation. He will continue to work on this and does not want any assistance with chantix.   Disposition:   F/U as needed  Previous records reviewed including angiogram procedures, discussed with the patient  Total encounter time more than 60 minutes  Greater than 50% was spent in counseling and coordination of  care with the patient    Signed, Dossie Arbourim Zacari Stiff, M.D., Ph.D. [facility], [place] [phone]

## 2019-01-29 ENCOUNTER — Other Ambulatory Visit: Payer: Self-pay

## 2019-01-29 ENCOUNTER — Encounter: Payer: Self-pay | Admitting: Cardiovascular Disease

## 2019-01-29 ENCOUNTER — Ambulatory Visit (INDEPENDENT_AMBULATORY_CARE_PROVIDER_SITE_OTHER): Payer: Self-pay | Admitting: Cardiovascular Disease

## 2019-01-29 VITALS — BP 128/72 | HR 60 | Ht 70.0 in | Wt 232.0 lb

## 2019-01-29 DIAGNOSIS — I739 Peripheral vascular disease, unspecified: Secondary | ICD-10-CM

## 2019-01-29 DIAGNOSIS — F17200 Nicotine dependence, unspecified, uncomplicated: Secondary | ICD-10-CM

## 2019-01-29 DIAGNOSIS — R9431 Abnormal electrocardiogram [ECG] [EKG]: Secondary | ICD-10-CM

## 2019-01-29 DIAGNOSIS — I96 Gangrene, not elsewhere classified: Secondary | ICD-10-CM

## 2019-01-29 NOTE — Addendum Note (Signed)
Addended by: Minna Merritts on: 01/29/2019 12:57 PM   Modules accepted: Level of Service

## 2019-01-29 NOTE — Patient Instructions (Signed)
Medication Instructions:  No changes  If you need a refill on your cardiac medications before your next appointment, please call your pharmacy.    Lab work: No new labs needed   If you have labs (blood work) drawn today and your tests are completely normal, you will receive your results only by: Marland Kitchen MyChart Message (if you have MyChart) OR . A paper copy in the mail If you have any lab test that is abnormal or we need to change your treatment, we will call you to review the results.   Testing/Procedures: No new testing needed   Follow-Up: At [facility], you and your health needs are our priority.  As part of our continuing mission to provide you with exceptional heart care, we have created designated Provider Care Teams.  These Care Teams include your primary Cardiologist (physician) and Advanced Practice Providers (APPs -  Physician Assistants and Nurse Practitioners) who all work together to provide you with the care you need, when you need it.  . You will need a follow up as needed  . Providers on your designated Care Team:   . Murray Hodgkins, NP . Christell Faith, PA-C . Marrianne Mood, PA-C  Any Other Special Instructions Will Be Listed Below (If Applicable).  For educational health videos Log in to : [email or web address] Or : [email or web address], password : [email or web address]

## 2019-01-31 ENCOUNTER — Encounter (INDEPENDENT_AMBULATORY_CARE_PROVIDER_SITE_OTHER): Payer: Self-pay

## 2019-01-31 NOTE — Telephone Encounter (Signed)
Patient has been cleared for surgery and will do his Covid test on 02/05/2019 between 12:30-2:30 pm at the [facility]. The pre-surgical

## 2019-02-01 ENCOUNTER — Ambulatory Visit: Payer: Self-pay | Admitting: Internal Medicine

## 2019-02-02 ENCOUNTER — Other Ambulatory Visit (INDEPENDENT_AMBULATORY_CARE_PROVIDER_SITE_OTHER): Payer: Self-pay | Admitting: Nurse Practitioner

## 2019-02-05 ENCOUNTER — Other Ambulatory Visit: Payer: Self-pay

## 2019-02-05 ENCOUNTER — Other Ambulatory Visit
Admission: RE | Admit: 2019-02-05 | Discharge: 2019-02-05 | Disposition: A | Discharge status: Home / Self Care | Payer: HRSA Program | Source: Ambulatory Visit | Attending: Vascular Surgery | Admitting: Vascular Surgery

## 2019-02-05 DIAGNOSIS — Z01812 Encounter for preprocedural laboratory examination: Secondary | ICD-10-CM | POA: Diagnosis present

## 2019-02-05 DIAGNOSIS — Z20828 Contact with and (suspected) exposure to other viral communicable diseases: Secondary | ICD-10-CM | POA: Insufficient documentation

## 2019-02-06 LAB — SARS CORONAVIRUS 2 (TAT 6-24 HRS): SARS Coronavirus 2: NEGATIVE

## 2019-02-07 NOTE — Pre-Procedure Instructions (Signed)
Cardiac clearance on chart from Dr Rockey Situ. Low Risk

## 2019-02-08 ENCOUNTER — Encounter: Payer: Self-pay | Admitting: *Deleted

## 2019-02-08 ENCOUNTER — Ambulatory Visit: Payer: Self-pay | Admitting: Anesthesiology

## 2019-02-08 ENCOUNTER — Other Ambulatory Visit: Payer: Self-pay

## 2019-02-08 ENCOUNTER — Encounter
Admission: RE | Disposition: A | Discharge status: Home / Self Care | Payer: Self-pay | Source: Home / Self Care | Attending: Vascular Surgery

## 2019-02-08 ENCOUNTER — Inpatient Hospital Stay
Admission: RE | Admit: 2019-02-08 | Discharge: 2019-02-09 | DRG: 254 | Disposition: A | Discharge status: Home / Self Care | Payer: Self-pay | Attending: Vascular Surgery | Admitting: Vascular Surgery

## 2019-02-08 DIAGNOSIS — F17210 Nicotine dependence, cigarettes, uncomplicated: Secondary | ICD-10-CM | POA: Diagnosis present

## 2019-02-08 DIAGNOSIS — Z91013 Allergy to seafood: Secondary | ICD-10-CM

## 2019-02-08 DIAGNOSIS — Z7982 Long term (current) use of aspirin: Secondary | ICD-10-CM

## 2019-02-08 DIAGNOSIS — I7025 Atherosclerosis of native arteries of other extremities with ulceration: Secondary | ICD-10-CM

## 2019-02-08 DIAGNOSIS — M62241 Nontraumatic ischemic infarction of muscle, right hand: Secondary | ICD-10-CM | POA: Diagnosis present

## 2019-02-08 DIAGNOSIS — K219 Gastro-esophageal reflux disease without esophagitis: Secondary | ICD-10-CM | POA: Diagnosis present

## 2019-02-08 DIAGNOSIS — I70268 Atherosclerosis of native arteries of extremities with gangrene, other extremity: Principal | ICD-10-CM | POA: Diagnosis present

## 2019-02-08 DIAGNOSIS — Z7901 Long term (current) use of anticoagulants: Secondary | ICD-10-CM

## 2019-02-08 DIAGNOSIS — I96 Gangrene, not elsewhere classified: Secondary | ICD-10-CM

## 2019-02-08 HISTORY — PX: WOUND EXPLORATION: SHX6188

## 2019-02-08 HISTORY — PX: ENDARTERECTOMY: SHX5162

## 2019-02-08 LAB — CBC
HCT: 39.9 % (ref 39.0–52.0)
Hemoglobin: 13.7 g/dL (ref 13.0–17.0)
MCH: 31.4 pg (ref 26.0–34.0)
MCHC: 34.3 g/dL (ref 30.0–36.0)
MCV: 91.5 fL (ref 80.0–100.0)
Platelets: 288 10*3/uL (ref 150–400)
RBC: 4.36 MIL/uL (ref 4.22–5.81)
RDW: 12.9 % (ref 11.5–15.5)
WBC: 5.4 10*3/uL (ref 4.0–10.5)
nRBC: 0 % (ref 0.0–0.2)

## 2019-02-08 LAB — BASIC METABOLIC PANEL
Anion gap: 7 (ref 5–15)
BUN: 16 mg/dL (ref 6–20)
CO2: 23 mmol/L (ref 22–32)
Calcium: 8.9 mg/dL (ref 8.9–10.3)
Chloride: 105 mmol/L (ref 98–111)
Creatinine, Ser: 1.19 mg/dL (ref 0.61–1.24)
GFR calc Af Amer: >60 mL/min (ref >60)
GFR calc non Af Amer: >60 mL/min (ref >60)
Glucose, Bld: 107 mg/dL — ABNORMAL HIGH (ref 70–99)
Potassium: 3.9 mmol/L (ref 3.5–5.1)
Sodium: 135 mmol/L (ref 135–145)

## 2019-02-08 LAB — TYPE AND SCREEN
ABO/RH(D): B POS
Antibody Screen: NEGATIVE

## 2019-02-08 LAB — PROTIME-INR
INR: 1 (ref 0.8–1.2)
Prothrombin Time: 13 seconds (ref 11.4–15.2)

## 2019-02-08 LAB — APTT: aPTT: 25 seconds (ref 24–36)

## 2019-02-08 SURGERY — ENDARTERECTOMY, SUBCLAVIAN
Anesthesia: General | Laterality: Right

## 2019-02-08 MED ORDER — HEPARIN SODIUM (PORCINE) 1000 UNIT/ML IJ SOLN
INTRAMUSCULAR | Status: DC | PRN
  Administered 2019-02-08: 5000 [IU] via INTRAVENOUS

## 2019-02-08 MED ORDER — MORPHINE SULFATE (PF) 2 MG/ML IV SOLN: 2.0000 mg | INTRAVENOUS | Status: DC | PRN

## 2019-02-08 MED ORDER — GLYCOPYRROLATE 0.2 MG/ML IJ SOLN
INTRAMUSCULAR | Status: DC | PRN
  Administered 2019-02-08: 0.2 mg via INTRAVENOUS

## 2019-02-08 MED ORDER — ESMOLOL HCL 100 MG/10ML IV SOLN
INTRAVENOUS | Status: DC | PRN
  Administered 2019-02-08 (×2): 20 mg via INTRAVENOUS

## 2019-02-08 MED ORDER — ONDANSETRON HCL 4 MG PO TABS: 4.0000 mg | ORAL_TABLET | Freq: Four times a day (QID) | ORAL | Status: DC | PRN

## 2019-02-08 MED ORDER — HYDROCODONE-ACETAMINOPHEN 5-325 MG PO TABS
1.0000 | ORAL_TABLET | ORAL | Status: DC | PRN
  Administered 2019-02-08 – 2019-02-09 (×2): 1 via ORAL
  Administered 2019-02-09: 01:00:00 2 via ORAL
  Filled 2019-02-08: qty 2
  Filled 2019-02-08 (×2): qty 1

## 2019-02-08 MED ORDER — DEXAMETHASONE SODIUM PHOSPHATE 10 MG/ML IJ SOLN
INTRAMUSCULAR | Status: DC | PRN
  Administered 2019-02-08: 10 mg via INTRAVENOUS

## 2019-02-08 MED ORDER — ONDANSETRON HCL 4 MG/2ML IJ SOLN: 4.0000 mg | Freq: Four times a day (QID) | INTRAMUSCULAR | Status: DC | PRN

## 2019-02-08 MED ORDER — BUPIVACAINE-EPINEPHRINE (PF) 0.5% -1:200000 IJ SOLN
INTRAMUSCULAR | Status: AC
  Filled 2019-02-08: qty 30

## 2019-02-08 MED ORDER — CEFAZOLIN SODIUM-DEXTROSE 2-4 GM/100ML-% IV SOLN
2.0000 g | INTRAVENOUS | Status: AC
  Administered 2019-02-08 (×2): 2 g via INTRAVENOUS

## 2019-02-08 MED ORDER — OXYCODONE HCL 5 MG PO TABS: 5.0000 mg | ORAL_TABLET | Freq: Once | ORAL | Status: DC | PRN

## 2019-02-08 MED ORDER — FENTANYL CITRATE (PF) 100 MCG/2ML IJ SOLN
INTRAMUSCULAR | Status: AC
  Filled 2019-02-08: qty 2

## 2019-02-08 MED ORDER — DEXMEDETOMIDINE HCL 200 MCG/2ML IV SOLN
INTRAVENOUS | Status: DC | PRN
  Administered 2019-02-08 (×5): 4 ug via INTRAVENOUS

## 2019-02-08 MED ORDER — CHLORHEXIDINE GLUCONATE CLOTH 2 % EX PADS: 6.0000 | MEDICATED_PAD | Freq: Once | CUTANEOUS | Status: DC

## 2019-02-08 MED ORDER — CEFAZOLIN SODIUM-DEXTROSE 2-4 GM/100ML-% IV SOLN
INTRAVENOUS | Status: AC
  Filled 2019-02-08: qty 100

## 2019-02-08 MED ORDER — FIBRIN SEALANT 2 ML SINGLE DOSE KIT
PACK | CUTANEOUS | Status: DC | PRN
  Administered 2019-02-08: 4 mL via TOPICAL

## 2019-02-08 MED ORDER — SORBITOL 70 % SOLN
30.0000 mL | Freq: Every day | Status: DC | PRN
  Filled 2019-02-08: qty 30

## 2019-02-08 MED ORDER — PROPOFOL 10 MG/ML IV BOLUS
INTRAVENOUS | Status: DC | PRN
  Administered 2019-02-08 (×2): 200 mg via INTRAVENOUS

## 2019-02-08 MED ORDER — FENTANYL CITRATE (PF) 100 MCG/2ML IJ SOLN
INTRAMUSCULAR | Status: AC
  Administered 2019-02-08: 18:00:00 50 ug via INTRAVENOUS
  Filled 2019-02-08: qty 2

## 2019-02-08 MED ORDER — EVICEL 2 ML EX KIT
PACK | CUTANEOUS | Status: AC
  Filled 2019-02-08: qty 2

## 2019-02-08 MED ORDER — FENTANYL CITRATE (PF) 100 MCG/2ML IJ SOLN
25.0000 ug | INTRAMUSCULAR | Status: DC | PRN
  Administered 2019-02-08 (×3): 50 ug via INTRAVENOUS

## 2019-02-08 MED ORDER — MIDAZOLAM HCL 2 MG/2ML IJ SOLN
INTRAMUSCULAR | Status: DC | PRN
  Administered 2019-02-08: 2 mg via INTRAVENOUS

## 2019-02-08 MED ORDER — SODIUM CHLORIDE 0.9 % IV SOLN
INTRAVENOUS | Status: DC | PRN
  Administered 2019-02-08: 500 mL via INTRAMUSCULAR

## 2019-02-08 MED ORDER — SUGAMMADEX SODIUM 500 MG/5ML IV SOLN
INTRAVENOUS | Status: DC | PRN
  Administered 2019-02-08: 300 mg via INTRAVENOUS

## 2019-02-08 MED ORDER — DOCUSATE SODIUM 100 MG PO CAPS
100.0000 mg | ORAL_CAPSULE | Freq: Two times a day (BID) | ORAL | Status: DC
  Administered 2019-02-08 – 2019-02-09 (×2): 100 mg via ORAL
  Filled 2019-02-08 (×2): qty 1

## 2019-02-08 MED ORDER — ASPIRIN EC 81 MG PO TBEC
81.0000 mg | DELAYED_RELEASE_TABLET | Freq: Every day | ORAL | Status: DC
  Administered 2019-02-09: 10:00:00 81 mg via ORAL
  Filled 2019-02-08: qty 1

## 2019-02-08 MED ORDER — FAMOTIDINE 20 MG PO TABS
ORAL_TABLET | ORAL | Status: AC
  Administered 2019-02-08: 10:00:00 20 mg via ORAL
  Filled 2019-02-08: qty 1

## 2019-02-08 MED ORDER — FLEET ENEMA 7-19 GM/118ML RE ENEM: 1.0000 | ENEMA | Freq: Once | RECTAL | Status: DC | PRN

## 2019-02-08 MED ORDER — DEXTROSE-NACL 5-0.9 % IV SOLN
INTRAVENOUS | Status: DC
  Administered 2019-02-08: 21:00:00 via INTRAVENOUS

## 2019-02-08 MED ORDER — LACTATED RINGERS IV SOLN
INTRAVENOUS | Status: DC
  Administered 2019-02-08 (×3): via INTRAVENOUS

## 2019-02-08 MED ORDER — SUCCINYLCHOLINE CHLORIDE 20 MG/ML IJ SOLN
INTRAMUSCULAR | Status: DC | PRN
  Administered 2019-02-08: 200 mg via INTRAVENOUS

## 2019-02-08 MED ORDER — IBUPROFEN 400 MG PO TABS: 400.0000 mg | ORAL_TABLET | Freq: Four times a day (QID) | ORAL | Status: DC | PRN

## 2019-02-08 MED ORDER — BUPIVACAINE-EPINEPHRINE (PF) 0.5% -1:200000 IJ SOLN
INTRAMUSCULAR | Status: DC | PRN
  Administered 2019-02-08: 20 mL

## 2019-02-08 MED ORDER — MAGNESIUM HYDROXIDE 400 MG/5ML PO SUSP: 30.0000 mL | Freq: Every day | ORAL | Status: DC | PRN

## 2019-02-08 MED ORDER — FENTANYL CITRATE (PF) 100 MCG/2ML IJ SOLN
INTRAMUSCULAR | Status: DC | PRN
  Administered 2019-02-08 (×2): 50 ug via INTRAVENOUS

## 2019-02-08 MED ORDER — OXYCODONE HCL 5 MG/5ML PO SOLN: 5.0000 mg | Freq: Once | ORAL | Status: DC | PRN

## 2019-02-08 MED ORDER — METOPROLOL TARTRATE 5 MG/5ML IV SOLN
INTRAVENOUS | Status: DC | PRN
  Administered 2019-02-08: 2 mg via INTRAVENOUS

## 2019-02-08 MED ORDER — ACETAMINOPHEN 650 MG RE SUPP: 650.0000 mg | Freq: Four times a day (QID) | RECTAL | Status: DC | PRN

## 2019-02-08 MED ORDER — MIDAZOLAM HCL 2 MG/2ML IJ SOLN
INTRAMUSCULAR | Status: AC
  Filled 2019-02-08: qty 2

## 2019-02-08 MED ORDER — DEXMEDETOMIDINE HCL IN NACL 80 MCG/20ML IV SOLN
INTRAVENOUS | Status: AC
  Filled 2019-02-08: qty 20

## 2019-02-08 MED ORDER — ONDANSETRON HCL 4 MG/2ML IJ SOLN
INTRAMUSCULAR | Status: DC | PRN
  Administered 2019-02-08: 4 mg via INTRAVENOUS

## 2019-02-08 MED ORDER — HEPARIN SODIUM (PORCINE) 5000 UNIT/ML IJ SOLN
INTRAMUSCULAR | Status: AC
  Filled 2019-02-08: qty 1

## 2019-02-08 MED ORDER — APIXABAN 5 MG PO TABS
5.0000 mg | ORAL_TABLET | Freq: Two times a day (BID) | ORAL | Status: DC
  Administered 2019-02-09: 5 mg via ORAL
  Filled 2019-02-08 (×2): qty 1

## 2019-02-08 MED ORDER — PROPOFOL 10 MG/ML IV BOLUS
INTRAVENOUS | Status: AC
  Filled 2019-02-08: qty 20

## 2019-02-08 MED ORDER — ROCURONIUM BROMIDE 100 MG/10ML IV SOLN
INTRAVENOUS | Status: DC | PRN
  Administered 2019-02-08: 50 mg via INTRAVENOUS

## 2019-02-08 MED ORDER — ACETAMINOPHEN 325 MG PO TABS: 650.0000 mg | ORAL_TABLET | Freq: Four times a day (QID) | ORAL | Status: DC | PRN

## 2019-02-08 MED ORDER — ESMOLOL HCL 100 MG/10ML IV SOLN: INTRAVENOUS | Status: DC | PRN

## 2019-02-08 MED ORDER — ASPIRIN EC 81 MG PO TBEC: 81.0000 mg | DELAYED_RELEASE_TABLET | Freq: Every day | ORAL | Status: DC

## 2019-02-08 MED ORDER — FAMOTIDINE 20 MG PO TABS
20.0000 mg | ORAL_TABLET | Freq: Once | ORAL | Status: AC
  Administered 2019-02-08: 10:00:00 20 mg via ORAL

## 2019-02-08 SURGICAL SUPPLY — 61 items
BAG DECANTER FOR FLEXI CONT (MISCELLANEOUS) ×4 IMPLANT
BLADE SURG SZ11 CARB STEEL (BLADE) ×4 IMPLANT
BOOT SUTURE AID YELLOW STND (SUTURE) ×6 IMPLANT
BRUSH SCRUB EZ  4% CHG (MISCELLANEOUS) ×2
BRUSH SCRUB EZ 4% CHG (MISCELLANEOUS) ×2 IMPLANT
CANISTER SUCT 1200ML W/VALVE (MISCELLANEOUS) ×4 IMPLANT
CATH EMBOLECTOMY 2X60 (CATHETERS) ×2 IMPLANT
CATH EMBOLECTOMY 3X80 (CATHETERS) ×2 IMPLANT
CHLORAPREP W/TINT 26 (MISCELLANEOUS) ×4 IMPLANT
CLIP SPRNG 6 S-JAW DBL (CLIP) ×2 IMPLANT
CLIP SPRNG 6MM S-JAW DBL (CLIP) ×8
COVER WAND RF STERILE (DRAPES) ×4 IMPLANT
DERMABOND ADVANCED (GAUZE/BANDAGES/DRESSINGS) ×4
DERMABOND ADVANCED .7 DNX12 (GAUZE/BANDAGES/DRESSINGS) ×2 IMPLANT
DRSG TELFA 4X3 1S NADH ST (GAUZE/BANDAGES/DRESSINGS) ×2 IMPLANT
ELECT CAUTERY BLADE 6.4 (BLADE) ×4 IMPLANT
ELECT REM PT RETURN 9FT ADLT (ELECTROSURGICAL) ×4
ELECTRODE REM PT RTRN 9FT ADLT (ELECTROSURGICAL) ×2 IMPLANT
GAUZE 4X4 16PLY RFD (DISPOSABLE) ×2 IMPLANT
GEL ULTRASOUND 20GR AQUASONIC (MISCELLANEOUS) IMPLANT
GLOVE BIO SURGEON STRL SZ7 (GLOVE) ×8 IMPLANT
GLOVE INDICATOR 7.5 STRL GRN (GLOVE) ×4 IMPLANT
GOWN STRL REUS W/ TWL LRG LVL3 (GOWN DISPOSABLE) ×4 IMPLANT
GOWN STRL REUS W/ TWL XL LVL3 (GOWN DISPOSABLE) ×2 IMPLANT
GOWN STRL REUS W/TWL LRG LVL3 (GOWN DISPOSABLE) ×8
GOWN STRL REUS W/TWL XL LVL3 (GOWN DISPOSABLE) ×4
HEMOSTAT SURGICEL 2X3 (HEMOSTASIS) ×10 IMPLANT
IV NS 500ML (IV SOLUTION) ×2
IV NS 500ML BAXH (IV SOLUTION) ×2 IMPLANT
KIT TURNOVER KIT A (KITS) ×4 IMPLANT
LABEL OR SOLS (LABEL) ×4 IMPLANT
LOOP RED MAXI  1X406MM (MISCELLANEOUS) ×4
LOOP VESSEL MAXI 1X406 RED (MISCELLANEOUS) ×2 IMPLANT
LOOP VESSEL MINI 0.8X406 BLUE (MISCELLANEOUS) ×2 IMPLANT
LOOPS BLUE MINI 0.8X406MM (MISCELLANEOUS) ×4
NDL FILTER BLUNT 18X1 1/2 (NEEDLE) ×2 IMPLANT
NEEDLE FILTER BLUNT 18X 1/2SAF (NEEDLE) ×2
NEEDLE FILTER BLUNT 18X1 1/2 (NEEDLE) ×2 IMPLANT
NS IRRIG 500ML POUR BTL (IV SOLUTION) ×4 IMPLANT
PACK EXTREMITY ARMC (MISCELLANEOUS) ×6 IMPLANT
PAD PREP 24X41 OB/GYN DISP (PERSONAL CARE ITEMS) ×4 IMPLANT
PATCH CAROTID ECM VASC 1X10 (Prosthesis & Implant Heart) ×2 IMPLANT
SOLUTION CELL SAVER (CLIP) ×4 IMPLANT
STOCKINETTE 48X4 2 PLY STRL (GAUZE/BANDAGES/DRESSINGS) ×2 IMPLANT
STOCKINETTE STRL 4IN 9604848 (GAUZE/BANDAGES/DRESSINGS) ×8 IMPLANT
SUT ETHILON 3-0 FS-10 30 BLK (SUTURE) ×4
SUT MNCRL AB 4-0 PS2 18 (SUTURE) ×4 IMPLANT
SUT PROLENE 6 0 BV (SUTURE) ×22 IMPLANT
SUT SILK 2 0 (SUTURE) ×8
SUT SILK 2-0 18XBRD TIE 12 (SUTURE) ×2 IMPLANT
SUT SILK 3 0 (SUTURE) ×2
SUT SILK 3 0 12 30 (SUTURE) ×2 IMPLANT
SUT SILK 3-0 18XBRD TIE 12 (SUTURE) ×2 IMPLANT
SUT SILK 4 0 (SUTURE) ×2
SUT SILK 4-0 18XBRD TIE 12 (SUTURE) ×2 IMPLANT
SUT VIC AB 3-0 SH 27 (SUTURE) ×6
SUT VIC AB 3-0 SH 27X BRD (SUTURE) ×2 IMPLANT
SUTURE EHLN 3-0 FS-10 30 BLK (SUTURE) IMPLANT
SYR 20ML LL LF (SYRINGE) ×6 IMPLANT
SYR 3ML LL SCALE MARK (SYRINGE) ×4 IMPLANT
SYR TB 1ML 27GX1/2 LL (SYRINGE) ×2 IMPLANT

## 2019-02-08 NOTE — Anesthesia Post-op Follow-up Note (Signed)
Anesthesia QCDR form completed.        

## 2019-02-08 NOTE — Anesthesia Preprocedure Evaluation (Signed)
Anesthesia Evaluation  Patient identified by MRN, date of birth, ID band Patient awake    Reviewed: Allergy & Precautions, H&P , NPO status , Patient's Chart, lab work & pertinent test results  Airway Mallampati: III  TM Distance: >3 FB Neck ROM: full    Dental  (+) Chipped, Poor Dentition   Pulmonary Current Smoker and Patient abstained from smoking.,  Signs and symptoms suggestive of sleep apnea            Cardiovascular Exercise Tolerance: Good (-) angina(-) Past MI and (-) DOE negative cardio ROS       Neuro/Psych negative neurological ROS  negative psych ROS   GI/Hepatic Neg liver ROS, GERD  Medicated and Controlled,  Endo/Other  negative endocrine ROS  Renal/GU      Musculoskeletal   Abdominal   Peds  Hematology negative hematology ROS (+)   Anesthesia Other Findings Past Medical History: No date: GERD (gastroesophageal reflux disease)     Comment:  occ-no meds No date: History of stomach ulcers  Past Surgical History: No date: ulcer rupture 01/03/2019: UPPER EXTREMITY ANGIOGRAPHY; Right     Comment:  Procedure: UPPER EXTREMITY ANGIOGRAPHY;  Surgeon: Algernon Huxley, MD;  Location: [place] [facility];  Service:               Cardiovascular;  Laterality: Right;  BMI    Body Mass Index: 33.21 kg/m      Reproductive/Obstetrics negative OB ROS                             Anesthesia Physical Anesthesia Plan  ASA: III  Anesthesia Plan: General ETT   Post-op Pain Management:    Induction: Intravenous  PONV Risk Score and Plan: Ondansetron, Dexamethasone, Midazolam and Treatment may vary due to age or medical condition  Airway Management Planned: Oral ETT  Additional Equipment:   Intra-op Plan:   Post-operative Plan: Extubation in OR  Informed Consent: I have reviewed the patients History and Physical, chart, labs and discussed the procedure including  the risks, benefits and alternatives for the proposed anesthesia with the patient or authorized representative who has indicated his/her understanding and acceptance.     Dental Advisory Given  Plan Discussed with: Anesthesiologist, CRNA and Surgeon  Anesthesia Plan Comments: (Patient consented for risks of anesthesia including but not limited to:  - adverse reactions to medications - damage to teeth, lips or other oral mucosa - sore throat or hoarseness - Damage to heart, brain, lungs or loss of life  Patient voiced understanding.)        Anesthesia Quick Evaluation

## 2019-02-08 NOTE — Op Note (Signed)
[facility]   OPERATIVE NOTE  DATE: 02/08/2019  PRE-OPERATIVE DIAGNOSIS: Ischemia of the right upper extremity with gangrenous changes to the fingertips  POST-OPERATIVE DIAGNOSIS: same as above  PROCEDURE: 1.   Right brachial and ulnar thromboendarterectomy and patch angioplasty with a core matrix patch 2.   Right radial artery Fogarty embolectomy with a 2 Fogarty balloon 3.   Right axillary and proximal brachial artery Fogarty embolectomy with a 3 Fogarty balloon 4.   Evacuation of hematoma right arm with arm exploration and closure of the wound.  SURGEON: Festus BarrenJason Ranier Coach  ASSISTANT(S): Raul DelKim Stegmayer, PA-C  ANESTHESIA: General  ESTIMATED BLOOD LOSS: 75 cc  FINDING(S): 1.  Occlusion of the brachial artery, radial artery, and ulnar artery with markedly improved flow and palpable radial pulse at the end of the procedure.   INDICATIONS:   Kelly Brooks is a 33 y.o. male who presents with gangrenous changes to his second fingertip and ischemic Brooks of his right hand.  He has had a previous percutaneous intervention that reoccluded shortly after performance and his hand ischemia remains. An assistant was present during the procedure to help facilitate the exposure and expedite the procedure.  Risks and benefits were discussed and informed consent was obtained.  DESCRIPTION: After obtaining full informed written consent, the patient was brought back to the operating room and placed supine upon the operating table.  The patient was prepped and draped in the standard fashion. The assistant provided retraction and mobilization to help facilitate exposure and expedite the procedure throughout the entire procedure.  This included following suture, using retractors, and optimizing lighting.  A curvilinear incision was created at the antecubital fossa.  As we dissected down to the brachial artery there was a dense fibrotic reaction around the brachial artery and the brachial  bifurcation making dissection exceedingly difficult and tedious.  Several venous branches were ligated divided between silk ties but care was taken not to harm any of the arterial branches to preserve flow.  We had to extend the incision proximally up to the mid upper arm and even at that point there a artery was pulseless and fibrotic.  Dissecting out the distal vessels was somewhat tedious.  What appeared to be the ulnar artery appeared to be the best outflow in about 5 cm beyond its origin it normalized although it was still pulseless.  The radial artery was also dissected out and encircled with Vesseloops.  The patient was systemically heparinized.  An arteriotomy was created with an 11 blade extended with Potts scissors in the distal brachial artery and extended down onto the first 2 to 3 cm of the ulnar artery.  Chronic appearing thrombus and plaque was seen and an endarterectomy was then performed [facility].  The distal endpoint in the ulnar artery appeared to be reasonably clean but there was not good backbleeding.  I then passed a 2 Fogarty balloon down the ulnar artery with thrombus removed and good backbleeding seen.  To address the radial artery, the 2 Fogarty embolectomy balloon was passed down about 20 cm down the radial artery.  A large amount of thrombus was returned and was sent as a separate specimen of the radial artery with return of backbleeding.  A total of 3 passes with the 2 Fogarty embolectomy balloon were done on the radial artery and 2 on the ulnar artery.  We extended the arteriotomy up the brachial artery but it remained reasonably pulseless and fibrotic.  I then  got a 3 Fogarty embolectomy balloon and passed this proximally approximately 35 to 40 cm which would be in the axillary subclavian artery.  A significant amount of thrombus was returned with brisk arterial inflow seen after the initial pass.  A second pass was made with no return of thrombus.  At this point, a core  matrix patch was brought onto the field and cut and beveled.  A 6-0 Prolene was started at the distal endpoint at the ulnar artery and run to the midportion of the arteriotomy.  It was then cut and beveled proximally and the entire length of the patch was used.  A second 6-0 Prolene was started at the proximal endpoint and run to complete the arteriotomy.  Prior to completing the arteriotomy, flushing was performed with good backbleeding and good inflow seen and no residual thrombus.  6-0 Prolene patch sutures were used for hemostasis after completion of the arteriotomy and hemostasis was complete.  Surgicel and absolute topical hemostatic agents were placed.  The wound was then closed with some interrupted 3-0 Vicryl, a running 3-0 Vicryl, and 4-0 Monocryl was buttressed with some nylon mattress sutures in the skin. At this point, we elected to complete the procedure.  The patient was awakened but before leaving the operating room it was noticed that he had developed some swelling in his proximal forearm and distal upper arm around the incision.  As he emerged from anesthesia his pressure had increased and he had clearly developed some bleeding.  Before we left the operating room, I elected to put him back on the operating room table from the stretcher and explore the arm for bleeding.  He was put back under anesthesia and the arm was sterilely prepped and draped.  The wound was reopened and a moderate amount of hematoma was seen and evacuated.  The wound was copiously irrigated.  All of the previous Surgicel was removed and the arteriotomy and suture line were interrogated vigorously with no signs of bleeding.  Several small muscular areas and subcutaneous areas were treated with electrocautery but these were not brisk.  No obvious sign of bleeding was seen, and after the wound was again irrigated and evaluated for bleeding we proceeded with closure.  Again a layer of interrupted 3-0 Vicryl were placed, a layer  of running 3-0 Vicryl was placed, and 4-0 Monocryl buttressed with some nylon mattress sutures were performed for skin closure.  At this point, the patient was awakened from anesthesia and taken to the recovery room in stable condition.   Kelly Brooks  02/08/2019, 5:09 PM    This note was created with Dragon Medical transcription system. Any errors in dictation are purely unintentional.

## 2019-02-08 NOTE — OR Nursing (Signed)
Reopened incision to explore wound swelling

## 2019-02-08 NOTE — Anesthesia Procedure Notes (Signed)
Procedure Name: Intubation Performed by: Fletcher-Harrison, Santasia Rew, CRNA Pre-anesthesia Checklist: Patient identified, Emergency Drugs available, Suction available and Patient being monitored Patient Re-evaluated:Patient Re-evaluated prior to induction Oxygen Delivery Method: Circle system utilized Preoxygenation: Pre-oxygenation with 100% oxygen Induction Type: IV induction Ventilation: Mask ventilation without difficulty Laryngoscope Size: McGraph and 3 Grade View: Grade I Tube type: Oral Tube size: 7.0 mm Number of attempts: 1 Airway Equipment and Method: Stylet Placement Confirmation: ETT inserted through vocal cords under direct vision,  positive ETCO2,  CO2 detector and breath sounds checked- equal and bilateral Secured at: 22 cm Tube secured with: Tape Dental Injury: Teeth and Oropharynx as per pre-operative assessment        

## 2019-02-08 NOTE — Transfer of Care (Signed)
Immediate Anesthesia Transfer of Care Note  Patient: Kelly Brooks  Procedure(s) Performed: RIGHT BRACHIAL AND RADIAL ENDARTERECTOMY (Right )  Patient Location: PACU  Anesthesia Type:General  Level of Consciousness: drowsy and patient cooperative  Airway & Oxygen Therapy: Patient Spontanous Breathing and Patient connected to face mask oxygen  Post-op Assessment: Report given to RN and Post -op Vital signs reviewed and stable  Post vital signs: Reviewed and stable  Last Vitals:  Vitals Value Taken Time  BP 116/90 02/08/19 1734  Temp    Pulse 81 02/08/19 1734  Resp 23 02/08/19 1734  SpO2 100 % 02/08/19 1734  Vitals shown include unvalidated device data.  Last Pain:  Vitals:   02/08/19 1727  TempSrc:   PainSc: (P) Asleep         Complications: No apparent anesthesia complications

## 2019-02-08 NOTE — H&P (Signed)
[facility] History & Physical Update  The patient was interviewed and re-examined.  The patient's previous History and Physical has been reviewed and is unchanged.  There is no change in the plan of care. We plan to proceed with the scheduled procedure.  Leotis Pain, MD  02/08/2019, 11:12 AM

## 2019-02-09 ENCOUNTER — Encounter: Payer: Self-pay | Admitting: Vascular Surgery

## 2019-02-09 DIAGNOSIS — I96 Gangrene, not elsewhere classified: Secondary | ICD-10-CM

## 2019-02-09 LAB — CBC
HCT: 36.6 % — ABNORMAL LOW (ref 39.0–52.0)
Hemoglobin: 12.5 g/dL — ABNORMAL LOW (ref 13.0–17.0)
MCH: 31.2 pg (ref 26.0–34.0)
MCHC: 34.2 g/dL (ref 30.0–36.0)
MCV: 91.3 fL (ref 80.0–100.0)
Platelets: 276 10*3/uL (ref 150–400)
RBC: 4.01 MIL/uL — ABNORMAL LOW (ref 4.22–5.81)
RDW: 12.6 % (ref 11.5–15.5)
WBC: 13.5 10*3/uL — ABNORMAL HIGH (ref 4.0–10.5)
nRBC: 0 % (ref 0.0–0.2)

## 2019-02-09 LAB — BASIC METABOLIC PANEL
Anion gap: 6 (ref 5–15)
BUN: 13 mg/dL (ref 6–20)
CO2: 24 mmol/L (ref 22–32)
Calcium: 8.5 mg/dL — ABNORMAL LOW (ref 8.9–10.3)
Chloride: 104 mmol/L (ref 98–111)
Creatinine, Ser: 1.11 mg/dL (ref 0.61–1.24)
GFR calc Af Amer: >60 mL/min (ref >60)
GFR calc non Af Amer: >60 mL/min (ref >60)
Glucose, Bld: 122 mg/dL — ABNORMAL HIGH (ref 70–99)
Potassium: 3.9 mmol/L (ref 3.5–5.1)
Sodium: 134 mmol/L — ABNORMAL LOW (ref 135–145)

## 2019-02-09 MED ORDER — OXYCODONE-ACETAMINOPHEN 5-325 MG PO TABS: 1.0000 | ORAL_TABLET | Freq: Four times a day (QID) | ORAL | 0 refills | Status: DC | PRN

## 2019-02-09 MED ORDER — KETOROLAC TROMETHAMINE 30 MG/ML IJ SOLN
30.0000 mg | Freq: Once | INTRAMUSCULAR | Status: AC
  Administered 2019-02-09: 10:00:00 30 mg via INTRAVENOUS
  Filled 2019-02-09: qty 1

## 2019-02-09 MED ORDER — OXYCODONE-ACETAMINOPHEN 5-325 MG PO TABS: 1.0000 | ORAL_TABLET | ORAL | Status: DC | PRN

## 2019-02-09 MED ORDER — APIXABAN 5 MG PO TABS: 5.0000 mg | ORAL_TABLET | Freq: Two times a day (BID) | ORAL | 6 refills | Status: AC

## 2019-02-09 NOTE — Discharge Instructions (Signed)
Vascular Surgery Discharge Instructions 1) You may shower as of Sunday.  Remove ACE bandage on Sunday before you shower. 2) Gently clean the incision with soap and gently pat dry. 3) Try to elevate your arm heart level or higher as much as possible.

## 2019-02-09 NOTE — Discharge Summary (Signed)
[facility]    Discharge Summary  Patient ID:  Kelly SaverJustin Devel Bachtell MRN: [id number] DOB/AGE: [birth year] 32 y.o.  Admit date: 02/08/2019 Discharge date: 02/09/2019 Date of Surgery: 02/08/2019 Surgeon: Surgeon(s): Dew, Marlow BaarsJason S, MD  Admission Diagnosis: ASO WITH GANGRENE RIGHT HAND  Discharge Diagnoses:  ASO WITH GANGRENE RIGHT HAND  Secondary Diagnoses: Past Medical History:  Diagnosis Date  . GERD (gastroesophageal reflux disease)    occ-no meds  . History of stomach ulcers    Procedure(s): RIGHT BRACHIAL RADIAL & ULNAR ENDARTERECTOMY  Discharged Condition: good  HPI / Hospital Course:  Kelly Brooks is a 33 y.o. male who presents with gangrenous changes to his second fingertip and ischemic pain of his right hand.  He has had a previous percutaneous intervention that reoccluded shortly after performance and his hand ischemia remains. On 02/08/19, the patient underwent:  1.   Right brachial and ulnar thromboendarterectomy and patch angioplasty with a core matrix patch 2.   Right radial artery Fogarty embolectomy with a 2 Fogarty balloon 3.   Right axillary and proximal brachial artery Fogarty embolectomy with a 3 Fogarty balloon 4.   Evacuation of hematoma right arm with arm exploration and closure of the wound.  He tolerated the procedure well was transferred from the recovery room to the surgical floor without issue.  The patient's night of surgery was unremarkable.  Postop day #1/day of discharge, the patient's diet was advanced, his pain was controlled to the use of p.o. pain medication, he was urinating without issue and ambulating at baseline.  At the time of discharge, the patient was afebrile with stable vital signs and his physical exam was as expected.  Physical exam:  Alert and oriented x3 Cardiovascular: Regular rate and rhythm Pulmonary: Clear to auscultation bilaterally Abdomen: Soft, nontender, nondistended Extremity:  Right upper  extremity: Covan removed.  Incision is clean dry and intact.  Upper arm soft forearm soft.  Hand is warm.  Good capillary refill.  Radial and ulnar pulses are 2+.  Motor/sensory is intact.  Minimal edema.  Labs as below  Complications: None  Consults: None  Significant Diagnostic Studies: CBC Lab Results  Component Value Date   WBC 13.5 (H) 02/09/2019   HGB 12.5 (L) 02/09/2019   HCT 36.6 (L) 02/09/2019   MCV 91.3 02/09/2019   PLT 276 02/09/2019   BMET    Component Value Date/Time   NA 134 (L) 02/09/2019 0413   K 3.9 02/09/2019 0413   CL 104 02/09/2019 0413   CO2 24 02/09/2019 0413   GLUCOSE 122 (H) 02/09/2019 0413   BUN 13 02/09/2019 0413   CREATININE 1.11 02/09/2019 0413   CALCIUM 8.5 (L) 02/09/2019 0413   GFRNONAA >60 02/09/2019 0413   GFRAA >60 02/09/2019 0413   COAG Lab Results  Component Value Date   INR 1.0 02/08/2019   INR 1.0 01/22/2019   Disposition:  Discharge to :Home  Allergies as of 02/09/2019      Reactions   Shellfish Allergy Hives   Tape    NG tube tape made tip of nose black      Medication List    TAKE these medications   apixaban 5 MG Tabs tablet Commonly known as: Eliquis Take 1 tablet (5 mg total) by mouth 2 (two) times daily.   aspirin EC 81 MG tablet Take 1 tablet (81 mg total) by mouth daily.   ibuprofen 200 MG tablet Commonly known as: ADVIL Take 400 mg by mouth every  6 (six) hours as needed.   oxyCODONE-acetaminophen 5-325 MG tablet Commonly known as: PERCOCET/ROXICET Take 1-2 tablets by mouth every 6 (six) hours as needed for moderate pain or severe pain.      Verbal and written Discharge instructions given to the patient. Wound care per Discharge AVS Follow-up Information    Kris Hartmann, NP.   Specialty: Vascular Surgery Why: Patient to be seen early next week (Tues / Wed) for incision check. No studies. Office to call back w/ Appt Contact information: [place] [id number] [phone]           Signed: Sela Hua, PA-C  02/09/2019, 10:57 AM

## 2019-02-10 LAB — HIV ANTIBODY (ROUTINE TESTING W REFLEX): HIV Screen 4th Generation wRfx: NONREACTIVE

## 2019-02-12 ENCOUNTER — Encounter: Payer: Self-pay | Admitting: Vascular Surgery

## 2019-02-12 LAB — SURGICAL PATHOLOGY

## 2019-02-13 ENCOUNTER — Telehealth (INDEPENDENT_AMBULATORY_CARE_PROVIDER_SITE_OTHER): Payer: Self-pay | Admitting: Vascular Surgery

## 2019-02-13 ENCOUNTER — Telehealth (INDEPENDENT_AMBULATORY_CARE_PROVIDER_SITE_OTHER): Payer: Self-pay

## 2019-02-13 NOTE — Telephone Encounter (Signed)
Patient left message on nurse line stating his fist has some swelling. Asking if this is normal. Patient had   RIGHT BRACHIAL AND RADIAL ENDARTERECTOMY (Right ) WOUND EXPLORATION Evacuation of hematoma  On 02/08/19. Please advise. AS< CMA

## 2019-02-13 NOTE — Telephone Encounter (Signed)
Patient called stating the top of his hand is swollen along with his forearm. Patient states that underneath his wrist it is still numb and that he thinks he had an allergic reaction to the Derma bond because he has burn like patches in the places where it was placed. Patient had right brachial and radial endarterectomy surgery on 02/08/2019.

## 2019-02-14 ENCOUNTER — Other Ambulatory Visit: Payer: Self-pay

## 2019-02-14 ENCOUNTER — Encounter (INDEPENDENT_AMBULATORY_CARE_PROVIDER_SITE_OTHER): Payer: Self-pay | Admitting: Nurse Practitioner

## 2019-02-14 ENCOUNTER — Ambulatory Visit (INDEPENDENT_AMBULATORY_CARE_PROVIDER_SITE_OTHER): Payer: Self-pay | Admitting: Nurse Practitioner

## 2019-02-14 VITALS — BP 138/89 | HR 89 | Resp 16 | Wt 231.0 lb

## 2019-02-14 DIAGNOSIS — M62241 Nontraumatic ischemic infarction of muscle, right hand: Secondary | ICD-10-CM

## 2019-02-14 DIAGNOSIS — F17200 Nicotine dependence, unspecified, uncomplicated: Secondary | ICD-10-CM

## 2019-02-14 MED ORDER — CEPHALEXIN 500 MG PO CAPS: 500.0000 mg | ORAL_CAPSULE | Freq: Two times a day (BID) | ORAL | 0 refills | Status: DC

## 2019-02-14 NOTE — Telephone Encounter (Signed)
Patient is on schedule for today to be seen. AS< CMA

## 2019-02-19 ENCOUNTER — Encounter (INDEPENDENT_AMBULATORY_CARE_PROVIDER_SITE_OTHER): Payer: Self-pay | Admitting: Nurse Practitioner

## 2019-02-19 ENCOUNTER — Other Ambulatory Visit: Payer: Self-pay

## 2019-02-19 ENCOUNTER — Ambulatory Visit (INDEPENDENT_AMBULATORY_CARE_PROVIDER_SITE_OTHER): Payer: Self-pay | Admitting: Nurse Practitioner

## 2019-02-19 VITALS — BP 177/107 | HR 72 | Resp 12 | Ht 69.0 in | Wt 229.0 lb

## 2019-02-19 DIAGNOSIS — M62241 Nontraumatic ischemic infarction of muscle, right hand: Secondary | ICD-10-CM

## 2019-02-19 DIAGNOSIS — F17200 Nicotine dependence, unspecified, uncomplicated: Secondary | ICD-10-CM

## 2019-02-19 NOTE — Progress Notes (Signed)
SUBJECTIVE:  Patient ID: Kelly Brooks, male    DOB: 12-28-1985, 33 y.o.   MRN: [id number] Chief Complaint  Patient presents with  . Follow-up    HPI  Kelly Brooks is a 33 y.o. male that presents today for wound evaluation after radial and brachial endarterectomy as well as embolectomy.  The patient previously had blisters that were ruptured here in office.  Some of the size now have some skin tears however the swelling is greatly decreased.  The patient does still have some soreness.  He denies any fever, chills, nausea, vomiting or diarrhea.  The patient is also able to utilize all 5 of his fingers however his second finger of his right hand has a healing ulcer that is still somewhat difficult to move.  Past Medical History:  Diagnosis Date  . GERD (gastroesophageal reflux disease)    occ-no meds  . History of stomach ulcers     Past Surgical History:  Procedure Laterality Date  . ENDARTERECTOMY Right 02/08/2019   Procedure: RIGHT BRACHIAL AND RADIAL ENDARTERECTOMY;  Surgeon: Algernon Huxley, MD;  Location: [facility];  Service: Vascular;  Laterality: Right;  . ulcer rupture    . UPPER EXTREMITY ANGIOGRAPHY Right 01/03/2019   Procedure: UPPER EXTREMITY ANGIOGRAPHY;  Surgeon: Algernon Huxley, MD;  Location: [facility];  Service: Cardiovascular;  Laterality: Right;  . WOUND EXPLORATION  02/08/2019   Procedure: WOUND EXPLORATION Evacuation of hematoma;  Surgeon: Algernon Huxley, MD;  Location: [facility];  Service: Vascular;;    Social History   Socioeconomic History  . Marital status: Single    Spouse name: Not on file  . Number of children: Not on file  . Years of education: Not on file  . Highest education level: Not on file  Occupational History  . Not on file  Social Needs  . Financial resource strain: Not on file  . Food insecurity    Worry: Not on file    Inability: Not on file  . Transportation needs    Medical: Not on file    Non-medical: Not on file   Tobacco Use  . Smoking status: Current Every Day Smoker    Packs/day: 1.00    Years: 15.00    Pack years: 15.00    Types: Cigarettes  . Smokeless tobacco: Never Used  Substance and Sexual Activity  . Alcohol use: Yes    Alcohol/week: 2.0 standard drinks    Types: 2 Cans of beer per week    Comment: occasional  . Drug use: Not Currently  . Sexual activity: Yes  Lifestyle  . Physical activity    Days per week: Not on file    Minutes per session: Not on file  . Stress: Not on file  Relationships  . Social Herbalist on phone: Not on file    Gets together: Not on file    Attends religious service: Not on file    Active member of club or organization: Not on file    Attends meetings of clubs or organizations: Not on file    Relationship status: Not on file  . Intimate partner violence    Fear of current or ex partner: Not on file    Emotionally abused: Not on file    Physically abused: Not on file    Forced sexual activity: Not on file  Other Topics Concern  . Not on file  Social History Narrative   Live with girlfriend  History reviewed. No pertinent family history.  Allergies  Allergen Reactions  . Shellfish Allergy Hives  . Tape     NG tube tape made tip of nose black     Review of Systems   Review of Systems: Negative Unless Checked Constitutional: [] Weight loss  [] Fever  [] Chills Cardiac: [] Chest pain   []  Atrial Fibrillation  [] Palpitations   [] Shortness of breath when laying flat   [] Shortness of breath with exertion. [] Shortness of breath at rest Vascular:  [] Pain in legs with walking   [] Pain in legs with standing [] Pain in legs when laying flat   [] Claudication    [] Pain in feet when laying flat    [] History of DVT   [] Phlebitis   [] Swelling in legs   [] Varicose veins   [] Non-healing ulcers Pulmonary:   [] Uses home oxygen   [] Productive cough   [] Hemoptysis   [] Wheeze  [] COPD   [] Asthma Neurologic:  [] Dizziness   [] Seizures  [] Blackouts  [] History of stroke   [] History of TIA  [] Aphasia   [] Temporary Blindness   [x] Weakness or numbness in arm   [] Weakness or numbness in leg Musculoskeletal:   [] Joint swelling   [] Joint pain   [] Low back pain  []  History of Knee Replacement [] Arthritis [] back Surgeries  []  Spinal Stenosis    Hematologic:  [] Easy bruising  [] Easy bleeding   [] Hypercoagulable state   [] Anemic Gastrointestinal:  [] Diarrhea   [] Vomiting  [] Gastroesophageal reflux/heartburn   [] Difficulty swallowing. [] Abdominal pain Genitourinary:  [] Chronic kidney disease   [] Difficult urination  [] Anuric   [] Blood in urine [] Frequent urination  [] Burning with urination   [] Hematuria Skin:  [] Rashes   [] Ulcers [x] Wounds Psychological:  [] History of anxiety   []  History of major depression  []  Memory Difficulties      OBJECTIVE:   Physical Exam  BP (!) 177/107 (BP Location: Left Wrist, Patient Position: Sitting, Cuff Size: Normal)   Pulse 72   Resp 12   Ht 5\' 9"  (1.753 m)   Wt 229 lb (103.9 kg)   BMI 33.82 kg/m   Gen: WD/WN, NAD Head: [place]/AT, No temporalis wasting.  Ear/Nose/Throat: Hearing grossly intact, nares w/o erythema or drainage Eyes: PER, EOMI, sclera nonicteric.  Neck: Supple, no masses.  No JVD.  Pulmonary:  Good air movement, no use of accessory muscles.  Cardiac: RRR Vascular:  Vessel Right Left  Radial Palpable Palpable   Gastrointestinal: soft, non-distended. No guarding/no peritoneal signs.  Musculoskeletal: M/S 5/5 throughout.  No deformity or atrophy.  Neurologic: Pain and light touch intact in extremities.  Symmetrical.  Speech is fluent. Motor exam as listed above. Psychiatric: Judgment intact, Mood & affect appropriate for pt's clinical situation. Dermatologic:  Skin tear in areas where blisters were.  Wound is well approximated with no oozing.  Lymph : No Cervical lymphadenopathy, no lichenification or skin changes of chronic lymphedema.       ASSESSMENT AND PLAN:  1. Tobacco use disorder  Smoking cessation was discussed, 3-10 minutes spent on this topic specifically   2. 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  We will have the patient use antibiotic ointment and gauze to cover the wound.  We will have him follow-up in 2 weeks for wound assessment and possible suture removal.   Current Outpatient Medications on File Prior to Visit  Medication Sig Dispense Refill  . apixaban (ELIQUIS) 5 MG TABS tablet Take 1 tablet (5 mg  total) by mouth 2 (two) times daily. 60 tablet 6  . aspirin EC 81 MG tablet Take 1 tablet (81 mg total) by mouth daily. 150 tablet 2  . cephALEXin (KEFLEX) 500 MG capsule Take 1 capsule (500 mg total) by mouth 2 (two) times daily. 14 capsule 0  . ibuprofen (ADVIL) 200 MG tablet Take 400 mg by mouth every 6 (six) hours as needed.    Marland Kitchen oxyCODONE-acetaminophen (PERCOCET/ROXICET) 5-325 MG tablet Take 1-2 tablets by mouth every 6 (six) hours as needed for moderate pain or severe pain. 50 tablet 0   No current facility-administered medications on file prior to visit.     There are no Patient Instructions on file for this visit. No follow-ups on file.   Georgiana Spinner, NP  This note was completed with Office manager.  Any errors are purely unintentional.

## 2019-02-19 NOTE — Anesthesia Postprocedure Evaluation (Signed)
Anesthesia Post Note  Patient: Kelly Brooks  Procedure(s) Performed: RIGHT BRACHIAL AND RADIAL ENDARTERECTOMY (Right ) WOUND EXPLORATION Evacuation of hematoma  Patient location during evaluation: PACU Anesthesia Type: General Level of consciousness: awake and alert Pain management: pain level controlled Vital Signs Assessment: post-procedure vital signs reviewed and stable Respiratory status: spontaneous breathing, nonlabored ventilation, respiratory function stable and patient connected to nasal cannula oxygen Cardiovascular status: blood pressure returned to baseline and stable Postop Assessment: no apparent nausea or vomiting Anesthetic complications: no     Last Vitals:  Vitals:   02/09/19 0456 02/09/19 0759  BP: 134/76 (!) 146/92  Pulse: 70 (!) 57  Resp: 20   Temp: 36.9 C 36.9 C  SpO2: 99% 99%    Last Pain:  Vitals:   02/09/19 0759  TempSrc: Oral  PainSc:                  [name]

## 2019-02-19 NOTE — Progress Notes (Signed)
SUBJECTIVE:  Patient ID: Kelly Brooks, male    DOB: Oct 29, 1985, 33 y.o.   MRN: [id number] Chief Complaint  Patient presents with  . Follow-up    [facility] incision check    HPI  Kelly Brooks is a 33 y.o. male that presents today following a brachial and ulnar thromboendarterectomy and patch angioplasty with a core matrix patch.  He also underwent right radial embolectomy, as well as right axillary and proximal brachial artery embolectomy.  There was also an evacuation of a right arm hematoma.  This was done on 02/08/2019.  The patient contacted our office on 02/12/2021 state that he been having some severe swelling as well as some blistering around the wound.  Today the wound is actually well approximated however there are multiple fluid-filled blisters around his wound.  This is likely a reaction due to the dressing that is used postsurgically.  Otherwise, the patient is doing well.  He endorses having some swelling when he tries to elevate above the level of the heart he denies any fever, chills, nausea, vomiting or diarrhea.  He denies any chest pain or shortness of breath.  Past Medical History:  Diagnosis Date  . GERD (gastroesophageal reflux disease)    occ-no meds  . History of stomach ulcers     Past Surgical History:  Procedure Laterality Date  . ENDARTERECTOMY Right 02/08/2019   Procedure: RIGHT BRACHIAL AND RADIAL ENDARTERECTOMY;  Surgeon: Annice Needy, MD;  Location: [facility] ORS;  Service: Vascular;  Laterality: Right;  . ulcer rupture    . UPPER EXTREMITY ANGIOGRAPHY Right 01/03/2019   Procedure: UPPER EXTREMITY ANGIOGRAPHY;  Surgeon: Annice Needy, MD;  Location: [facility] INVASIVE CV LAB;  Service: Cardiovascular;  Laterality: Right;  . WOUND EXPLORATION  02/08/2019   Procedure: WOUND EXPLORATION Evacuation of hematoma;  Surgeon: Annice Needy, MD;  Location: [facility] ORS;  Service: Vascular;;    Social History   Socioeconomic History  . Marital status: Single    Spouse name:  Not on file  . Number of children: Not on file  . Years of education: Not on file  . Highest education level: Not on file  Occupational History  . Not on file  Social Needs  . Financial resource strain: Not on file  . Food insecurity    Worry: Not on file    Inability: Not on file  . Transportation needs    Medical: Not on file    Non-medical: Not on file  Tobacco Use  . Smoking status: Current Every Day Smoker    Packs/day: 1.00    Years: 15.00    Pack years: 15.00    Types: Cigarettes  . Smokeless tobacco: Never Used  Substance and Sexual Activity  . Alcohol use: Yes    Alcohol/week: 2.0 standard drinks    Types: 2 Cans of beer per week    Comment: occasional  . Drug use: Not Currently  . Sexual activity: Yes  Lifestyle  . Physical activity    Days per week: Not on file    Minutes per session: Not on file  . Stress: Not on file  Relationships  . Social Musician on phone: Not on file    Gets together: Not on file    Attends religious service: Not on file    Active member of club or organization: Not on file    Attends meetings of clubs or organizations: Not on file    Relationship status: Not  on file  . Intimate partner violence    Fear of current or ex partner: Not on file    Emotionally abused: Not on file    Physically abused: Not on file    Forced sexual activity: Not on file  Other Topics Concern  . Not on file  Social History Narrative   Live with girlfriend    History reviewed. No pertinent family history.  Allergies  Allergen Reactions  . Shellfish Allergy Hives  . Tape     NG tube tape made tip of nose black     Review of Systems   Review of Systems: Negative Unless Checked Constitutional: [] Weight loss  [] Fever  [] Chills Cardiac: [] Chest pain   []  Atrial Fibrillation  [] Palpitations   [] Shortness of breath when laying flat   [] Shortness of breath with exertion. [] Shortness of breath at rest Vascular:  [] Pain in legs with  walking   [] Pain in legs with standing [] Pain in legs when laying flat   [] Claudication    [] Pain in feet when laying flat    [] History of DVT   [] Phlebitis   [] Swelling in legs   [] Varicose veins   [] Non-healing ulcers Pulmonary:   [] Uses home oxygen   [] Productive cough   [] Hemoptysis   [] Wheeze  [] COPD   [] Asthma Neurologic:  [] Dizziness   [] Seizures  [] Blackouts [] History of stroke   [] History of TIA  [] Aphasia   [] Temporary Blindness   [x] Weakness or numbness in arm   [] Weakness or numbness in leg Musculoskeletal:   [] Joint swelling   [] Joint pain   [] Low back pain  []  History of Knee Replacement [] Arthritis [] back Surgeries  []  Spinal Stenosis    Hematologic:  [] Easy bruising  [] Easy bleeding   [] Hypercoagulable state   [] Anemic Gastrointestinal:  [] Diarrhea   [] Vomiting  [] Gastroesophageal reflux/heartburn   [] Difficulty swallowing. [] Abdominal pain Genitourinary:  [] Chronic kidney disease   [] Difficult urination  [] Anuric   [] Blood in urine [] Frequent urination  [] Burning with urination   [] Hematuria Skin:  [] Rashes   [] Ulcers [x] Wounds Psychological:  [] History of anxiety   []  History of major depression  []  Memory Difficulties      OBJECTIVE:   Physical Exam  BP 138/89 (BP Location: Left Arm)   Pulse 89   Resp 16   Wt 231 lb (104.8 kg)   BMI 33.15 kg/m   Gen: WD/WN, NAD Head: [place]/AT, No temporalis wasting.  Ear/Nose/Throat: Hearing grossly intact, nares w/o erythema or drainage Eyes: PER, EOMI, sclera nonicteric.  Neck: Supple, no masses.  No JVD.  Pulmonary:  Good air movement, no use of accessory muscles.  Cardiac: RRR Vascular:  Well approximated wound with blisters located up and down the edges.  2+ edema of right upper extremity Vessel Right Left  Radial Palpable Palpable   Gastrointestinal: soft, non-distended. No guarding/no peritoneal signs.  Musculoskeletal: M/S 5/5 throughout.  No deformity or atrophy.  Neurologic: Pain and light touch intact in extremities.   Symmetrical.  Speech is fluent. Motor exam as listed above. Psychiatric: Judgment intact, Mood & affect appropriate for pt's clinical situation. Dermatologic: No Venous rashes. No Ulcers Noted.  No changes consistent with cellulitis. Lymph : No Cervical lymphadenopathy, no lichenification or skin changes of chronic lymphedema.       ASSESSMENT AND PLAN:  1. 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  We will apply Xeroform and place a compression wrap on  his arm in order to control the swelling.  We will have the patient follow-up on Monday in order to reevaluate the blisters as well as swelling.  2. Tobacco use disorder Smoking cessation was discussed, 3-10 minutes spent on this topic specifically    Current Outpatient Medications on File Prior to Visit  Medication Sig Dispense Refill  . apixaban (ELIQUIS) 5 MG TABS tablet Take 1 tablet (5 mg total) by mouth 2 (two) times daily. 60 tablet 6  . aspirin EC 81 MG tablet Take 1 tablet (81 mg total) by mouth daily. 150 tablet 2  . ibuprofen (ADVIL) 200 MG tablet Take 400 mg by mouth every 6 (six) hours as needed.    Marland Kitchen oxyCODONE-acetaminophen (PERCOCET/ROXICET) 5-325 MG tablet Take 1-2 tablets by mouth every 6 (six) hours as needed for moderate pain or severe pain. 50 tablet 0   No current facility-administered medications on file prior to visit.     There are no Patient Instructions on file for this visit. No follow-ups on file.   Kris Hartmann, NP  This note was completed with Sales executive.  Any errors are purely unintentional.

## 2019-02-21 ENCOUNTER — Other Ambulatory Visit (INDEPENDENT_AMBULATORY_CARE_PROVIDER_SITE_OTHER): Payer: Self-pay | Admitting: Nurse Practitioner

## 2019-02-21 ENCOUNTER — Telehealth (INDEPENDENT_AMBULATORY_CARE_PROVIDER_SITE_OTHER): Payer: Self-pay

## 2019-02-21 MED ORDER — OXYCODONE-ACETAMINOPHEN 5-325 MG PO TABS: 1.0000 | ORAL_TABLET | Freq: Four times a day (QID) | ORAL | 0 refills | Status: DC | PRN

## 2019-02-21 NOTE — Telephone Encounter (Signed)
I have just sent this into his [place] [facility]

## 2019-02-21 NOTE — Telephone Encounter (Signed)
Patient has made aware

## 2019-03-02 ENCOUNTER — Telehealth (INDEPENDENT_AMBULATORY_CARE_PROVIDER_SITE_OTHER): Payer: Self-pay

## 2019-03-02 NOTE — Telephone Encounter (Signed)
Patient called stating he is still having right arm and hand pain with swelling but when he takes his antibiotics the pain goes away. The patient stated that when he stretches his arm out it feels like something is pulling and at the top of the arm he his having some blood on the bandage and was wondering did a stitch pop open. I spoke with Eulogio Ditch NP and she advise for the patient to alternate ibuprofen and oxycodone for pain every 3 hours as needed.Also Arna Medici NP advise for the patient to keep bandage and use antibiotic ointment. The patient will keep current appointment as schedule.I spoke with the patient and he has been advise with Eulogio Ditch NP medical advice.

## 2019-03-06 ENCOUNTER — Encounter (INDEPENDENT_AMBULATORY_CARE_PROVIDER_SITE_OTHER): Payer: Self-pay | Admitting: Vascular Surgery

## 2019-03-06 ENCOUNTER — Other Ambulatory Visit: Payer: Self-pay

## 2019-03-06 ENCOUNTER — Ambulatory Visit (INDEPENDENT_AMBULATORY_CARE_PROVIDER_SITE_OTHER): Payer: Self-pay | Admitting: Vascular Surgery

## 2019-03-06 ENCOUNTER — Encounter (INDEPENDENT_AMBULATORY_CARE_PROVIDER_SITE_OTHER): Payer: Self-pay

## 2019-03-06 VITALS — BP 122/78 | HR 72 | Resp 16 | Wt 227.0 lb

## 2019-03-06 DIAGNOSIS — I96 Gangrene, not elsewhere classified: Secondary | ICD-10-CM

## 2019-03-06 DIAGNOSIS — M62241 Nontraumatic ischemic infarction of muscle, right hand: Secondary | ICD-10-CM

## 2019-03-06 NOTE — Assessment & Plan Note (Signed)
Status post open surgery for revascularization.  Although the flow appears improved, he is going to have a prolonged healing from a wound on the right arm that is fairly extensive.  There is likely some sort of allergic reaction to the Dermabond.  I have given him a sample for another month of Eliquis.  Have also refilled his Percocet due to continued pain from both gangrenous changes and the right arm wound.  We will reassess him in 2 to 3 weeks with a wound check.

## 2019-03-06 NOTE — Progress Notes (Signed)
Patient ID: Kelly Brooks, male   DOB: [birth year], 33 y.o.   MRN: [id number]  Chief Complaint  Patient presents with  . Follow-up    2week follow up    HPI Kelly SaverJustin Devel Beavin is a 33 y.o. male.  Patient returns in follow-up of his right arm wound.  His right second fingertip is drying up and the scab from the gangrenous changes the distal tip seems to be slowly improving.  His hand function is better.  The wound on his right arm is slowly improving.  He is clearly had some sort of allergic reaction to the Dermabond.  This has limited the mobility of his right arm.   Past Medical History:  Diagnosis Date  . GERD (gastroesophageal reflux disease)    occ-no meds  . History of stomach ulcers     Past Surgical History:  Procedure Laterality Date  . ENDARTERECTOMY Right 02/08/2019   Procedure: RIGHT BRACHIAL AND RADIAL ENDARTERECTOMY;  Surgeon: Annice Needyew, Chiron Campione S, MD;  Location: [facility] ORS;  Service: Vascular;  Laterality: Right;  . ulcer rupture    . UPPER EXTREMITY ANGIOGRAPHY Right 01/03/2019   Procedure: UPPER EXTREMITY ANGIOGRAPHY;  Surgeon: Annice Needyew, Lorita Forinash S, MD;  Location: [facility] INVASIVE CV LAB;  Service: Cardiovascular;  Laterality: Right;  . WOUND EXPLORATION  02/08/2019   Procedure: WOUND EXPLORATION Evacuation of hematoma;  Surgeon: Annice Needyew, Alexande Sheerin S, MD;  Location: [facility] ORS;  Service: Vascular;;      Allergies  Allergen Reactions  . Shellfish Allergy Hives  . Tape     NG tube tape made tip of nose black    Current Outpatient Medications  Medication Sig Dispense Refill  . apixaban (ELIQUIS) 5 MG TABS tablet Take 1 tablet (5 mg total) by mouth 2 (two) times daily. 60 tablet 6  . aspirin EC 81 MG tablet Take 1 tablet (81 mg total) by mouth daily. 150 tablet 2  . cephALEXin (KEFLEX) 500 MG capsule Take 1 capsule (500 mg total) by mouth 2 (two) times daily. 14 capsule 0  . ibuprofen (ADVIL) 200 MG tablet Take 400 mg by mouth every 6 (six) hours as needed.    Marland Kitchen. oxyCODONE-acetaminophen  (PERCOCET/ROXICET) 5-325 MG tablet Take 1-2 tablets by mouth every 6 (six) hours as needed for moderate pain or severe pain. 50 tablet 0   No current facility-administered medications for this visit.         Physical Exam BP 122/78 (BP Location: Left Arm)   Pulse 72   Resp 16   Wt 227 lb (103 kg)   BMI 33.52 kg/m  Gen:  WD/WN, NAD Skin: Wound of the right arm with some sloughing skin.  No obvious erythema.  It is dry.  There is mild separation just above the antecubital fossa and there is likely to be a little more separation below the antecubital fossa when the skin sloughs.     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  Although the flow appears improved, he is going to have a prolonged healing from a wound on the right arm that is fairly extensive.  There is likely some sort of allergic reaction to the Dermabond.  I have given him a sample for another month of Eliquis.  Have also refilled his Percocet due to continued pain from both gangrenous changes and the right arm wound.  We will reassess him in 2 to 3 weeks with a wound check.      Leotis Pain 03/06/2019, 2:50 PM   This note was created with Dragon medical transcription system.  Any errors from dictation are unintentional.

## 2019-03-06 NOTE — Assessment & Plan Note (Signed)
The gangrenous changes to the right hand seem to be slowly improving.  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

## 2019-03-20 ENCOUNTER — Encounter (INDEPENDENT_AMBULATORY_CARE_PROVIDER_SITE_OTHER): Payer: Self-pay | Admitting: Nurse Practitioner

## 2019-03-20 ENCOUNTER — Ambulatory Visit (INDEPENDENT_AMBULATORY_CARE_PROVIDER_SITE_OTHER): Payer: Self-pay | Admitting: Nurse Practitioner

## 2019-03-20 ENCOUNTER — Other Ambulatory Visit: Payer: Self-pay

## 2019-03-20 VITALS — BP 145/79 | HR 71 | Resp 12 | Ht 70.0 in | Wt 229.0 lb

## 2019-03-20 DIAGNOSIS — M62241 Nontraumatic ischemic infarction of muscle, right hand: Secondary | ICD-10-CM

## 2019-03-20 DIAGNOSIS — I96 Gangrene, not elsewhere classified: Secondary | ICD-10-CM

## 2019-03-20 DIAGNOSIS — F17200 Nicotine dependence, unspecified, uncomplicated: Secondary | ICD-10-CM

## 2019-03-20 MED ORDER — CEPHALEXIN 500 MG PO CAPS: 500.0000 mg | ORAL_CAPSULE | Freq: Two times a day (BID) | ORAL | 0 refills | Status: AC

## 2019-03-20 MED ORDER — OXYCODONE-ACETAMINOPHEN 5-325 MG PO TABS: 1.0000 | ORAL_TABLET | Freq: Four times a day (QID) | ORAL | 0 refills | Status: DC | PRN

## 2019-03-21 ENCOUNTER — Encounter (INDEPENDENT_AMBULATORY_CARE_PROVIDER_SITE_OTHER): Payer: Self-pay | Admitting: Nurse Practitioner

## 2019-03-21 NOTE — Progress Notes (Signed)
**Note Kelly-Identified via Obfuscation** SUBJECTIVE:  Patient ID: Kelly Brooks, male    DOB: June 21, 1986, [age] y.o.   MRN: [id number] Chief Complaint  Patient presents with  . Follow-up    HPI  Kelly Brooks is a 33 y.o. male that presents today for evaluation of his right upper extremity wound following brachial artery endarterectomy.  Initially the patient had a bad reaction to the Dermabond which caused issues with the skin integrity.  Eventually the patient had a full dehiscence of the area.  The patient has been doing dressing changes at home using triple antibiotic ointment as well as nonstick gauze.  Today the wound has a slight odor with very copious, purulent drainage.  However the wound bed appears to have a good deal of granulation tissue and is healing nicely.  The patient does continue to have pain in the upper extremity.  He does endorse leaving the wound open to air at times.  He states that his finger is starting to feel better.  The area has softened somewhat.  Overall the patient remains in good spirits.  Past Medical History:  Diagnosis Date  . GERD (gastroesophageal reflux disease)    occ-no meds  . History of stomach ulcers     Past Surgical History:  Procedure Laterality Date  . ENDARTERECTOMY Right 02/08/2019   Procedure: RIGHT BRACHIAL AND RADIAL ENDARTERECTOMY;  Surgeon: Annice Needyew, Jason S, MD;  Location: [facility] ORS;  Service: Vascular;  Laterality: Right;  . ulcer rupture    . UPPER EXTREMITY ANGIOGRAPHY Right 01/03/2019   Procedure: UPPER EXTREMITY ANGIOGRAPHY;  Surgeon: Annice Needyew, Jason S, MD;  Location: [facility] IN[facility];  Service: Cardiovascular;  Laterality: Right;  . WOUND EXPLORATION  02/08/2019   Procedure: WOUND EXPLORATION Evacuation of hematoma;  Surgeon: Annice Needyew, Jason S, MD;  Location: [facility] ORS;  Service: Vascular;;    Social History   Socioeconomic History  . Marital status: Single    Spouse name: Not on file  . Number of children: Not on file  . Years of education: Not on file  . Highest  education level: Not on file  Occupational History  . Not on file  Social Needs  . Financial resource strain: Not on file  . Food insecurity    Worry: Not on file    Inability: Not on file  . Transportation needs    Medical: Not on file    Non-medical: Not on file  Tobacco Use  . Smoking status: Current Every Day Smoker    Packs/day: 1.00    Years: 15.00    Pack years: 15.00    Types: Cigarettes  . Smokeless tobacco: Never Used  Substance and Sexual Activity  . Alcohol use: Yes    Alcohol/week: 2.0 standard drinks    Types: 2 Cans of beer per week    Comment: occasional  . Drug use: Not Currently  . Sexual activity: Yes  Lifestyle  . Physical activity    Days per week: Not on file    Minutes per session: Not on file  . Stress: Not on file  Relationships  . Social Musicianconnections    Talks on phone: Not on file    Gets together: Not on file    Attends religious service: Not on file    Active member of club or organization: Not on file    Attends meetings of clubs or organizations: Not on file    Relationship status: Not on file  . Intimate partner violence    Fear of  current or ex partner: Not on file    Emotionally abused: Not on file    Physically abused: Not on file    Forced sexual activity: Not on file  Other Topics Concern  . Not on file  Social History Narrative   Live with girlfriend    History reviewed. No pertinent family history.  Allergies  Allergen Reactions  . Shellfish Allergy Hives  . Tape     NG tube tape made tip of nose black     Review of Systems   Review of Systems: Negative Unless Checked Constitutional: [] Weight loss  [] Fever  [] Chills Cardiac: [] Chest pain   []  Atrial Fibrillation  [] Palpitations   [] Shortness of breath when laying flat   [] Shortness of breath with exertion. [] Shortness of breath at rest Vascular:  [] Pain in legs with walking   [] Pain in legs with standing [] Pain in legs when laying flat   [] Claudication    [] Pain in  feet when laying flat    [x] History of DVT   [] Phlebitis   [] Swelling in legs   [] Varicose veins   [] Non-healing ulcers Pulmonary:   [] Uses home oxygen   [] Productive cough   [] Hemoptysis   [] Wheeze  [] COPD   [] Asthma Neurologic:  [] Dizziness   [] Seizures  [] Blackouts [] History of stroke   [] History of TIA  [] Aphasia   [] Temporary Blindness   [] Weakness or numbness in arm   [] Weakness or numbness in leg Musculoskeletal:   [] Joint swelling   [] Joint pain   [] Low back pain  []  History of Knee Replacement [] Arthritis [] back Surgeries  []  Spinal Stenosis    Hematologic:  [] Easy bruising  [] Easy bleeding   [] Hypercoagulable state   [] Anemic Gastrointestinal:  [] Diarrhea   [] Vomiting  [x] Gastroesophageal reflux/heartburn   [] Difficulty swallowing. [] Abdominal pain Genitourinary:  [] Chronic kidney disease   [] Difficult urination  [] Anuric   [] Blood in urine [] Frequent urination  [] Burning with urination   [] Hematuria Skin:  [] Rashes   [] Ulcers [x] Wounds Psychological:  [] History of anxiety   []  History of major depression  []  Memory Difficulties      OBJECTIVE:   Physical Exam  BP (!) 145/79 (BP Location: Left Arm, Patient Position: Sitting, Cuff Size: Normal)   Pulse 71   Resp 12   Ht 5\' 10"  (1.778 m)   Wt 229 lb (103.9 kg)   BMI 32.86 kg/m   Gen: WD/WN, NAD Head: [place]/AT, No temporalis wasting.  Ear/Nose/Throat: Hearing grossly intact, nares w/o erythema or drainage Eyes: PER, EOMI, sclera nonicteric.  Neck: Supple, no masses.  No JVD.  Pulmonary:  Good air movement, no use of accessory muscles.  Cardiac: RRR Vascular:  4 inch wound along right upper arm.  Gangrenous fingertips on right hand second digit. Vessel Right Left  Radial Palpable Palpable   Gastrointestinal: soft, non-distended. No guarding/no peritoneal signs.  Musculoskeletal: M/S 5/5 throughout.  No deformity or atrophy.  Neurologic: Pain and light touch intact in extremities.  Symmetrical.  Speech is fluent. Motor exam as  listed above. Psychiatric: Judgment intact, Mood & affect appropriate for pt's clinical situation. Dermatologic: No Venous rashes. No Ulcers Noted.  No changes consistent with cellulitis. Lymph : No Cervical lymphadenopathy, no lichenification or skin changes of chronic lymphedema.       ASSESSMENT AND PLAN:  1. 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  Those removed today without issue.  We will give the patient some Keflex today to  see if we can dry up the wound somewhat.  We will also have the patient return to the office 2-3 times a week so that we can perform wound care in the office.  The patient does live with parents and he has been instructed that leaving the wound open to air increases the risk for infection which could have severe consequences.  Also discussed about the possible consequences of continuing to smoke as relates to wound healing as well as peripheral artery disease.  We will have the patient return to the office on Friday to redress the wound.  We will be utilizing Aquacel for the patient's dressing changes at this time. - oxyCODONE-acetaminophen (PERCOCET/ROXICET) 5-325 MG tablet; Take 1-2 tablets by mouth every 6 (six) hours as needed for moderate pain or severe pain.  Dispense: 50 tablet; Refill: 0 - cephALEXin (KEFLEX) 500 MG capsule; Take 1 capsule (500 mg total) by mouth 2 (two) times daily.  Dispense: 28 capsule; Refill: 0  2. Tobacco use disorder Smoking cessation was discussed, 3-10 minutes spent on this topic specifically   3. 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  The hope is that surgical intervention will not be necessary.  Given enough time we believe that the area will likely callus over and eventually slough off.  We will continue to monitor this area.   Current Outpatient Medications on File Prior to Visit  Medication Sig Dispense  Refill  . apixaban (ELIQUIS) 5 MG TABS tablet Take 1 tablet (5 mg total) by mouth 2 (two) times daily. 60 tablet 6  . aspirin EC 81 MG tablet Take 1 tablet (81 mg total) by mouth daily. 150 tablet 2  . ibuprofen (ADVIL) 200 MG tablet Take 400 mg by mouth every 6 (six) hours as needed.     No current facility-administered medications on file prior to visit.     There are no Patient Instructions on file for this visit. No follow-ups on file.   Kris Hartmann, NP  This note was completed with Sales executive.  Any errors are purely unintentional.

## 2019-03-23 ENCOUNTER — Ambulatory Visit (INDEPENDENT_AMBULATORY_CARE_PROVIDER_SITE_OTHER): Payer: Self-pay | Admitting: Nurse Practitioner

## 2019-03-23 ENCOUNTER — Other Ambulatory Visit: Payer: Self-pay

## 2019-03-23 VITALS — BP 112/78 | HR 57 | Resp 14 | Ht 70.0 in | Wt 234.0 lb

## 2019-03-23 DIAGNOSIS — M62241 Nontraumatic ischemic infarction of muscle, right hand: Secondary | ICD-10-CM

## 2019-03-23 NOTE — Progress Notes (Signed)
Dressing change, aquacell AG followed by guaze and ABD pad and kerlix wrap. Patient will return to office on Monday for dressing change. 

## 2019-03-25 ENCOUNTER — Encounter (INDEPENDENT_AMBULATORY_CARE_PROVIDER_SITE_OTHER): Payer: Self-pay | Admitting: Nurse Practitioner

## 2019-03-26 ENCOUNTER — Ambulatory Visit (INDEPENDENT_AMBULATORY_CARE_PROVIDER_SITE_OTHER): Payer: Self-pay | Admitting: Nurse Practitioner

## 2019-03-26 ENCOUNTER — Encounter (INDEPENDENT_AMBULATORY_CARE_PROVIDER_SITE_OTHER): Payer: Self-pay

## 2019-03-26 ENCOUNTER — Other Ambulatory Visit: Payer: Self-pay

## 2019-03-26 VITALS — BP 142/85 | HR 79 | Resp 16 | Wt 232.8 lb

## 2019-03-26 DIAGNOSIS — M62241 Nontraumatic ischemic infarction of muscle, right hand: Secondary | ICD-10-CM

## 2019-03-26 NOTE — Progress Notes (Signed)
Dressing change, aquacell AG followed by guaze and ABD pad and kerlix wrap.  Patient will return to office on Thursday for dressing change.

## 2019-03-29 ENCOUNTER — Ambulatory Visit (INDEPENDENT_AMBULATORY_CARE_PROVIDER_SITE_OTHER): Payer: Self-pay | Admitting: Nurse Practitioner

## 2019-03-29 ENCOUNTER — Other Ambulatory Visit: Payer: Self-pay

## 2019-03-29 ENCOUNTER — Encounter (INDEPENDENT_AMBULATORY_CARE_PROVIDER_SITE_OTHER): Payer: Self-pay

## 2019-03-29 VITALS — BP 142/93 | HR 82 | Resp 16 | Wt 232.0 lb

## 2019-03-29 DIAGNOSIS — M62241 Nontraumatic ischemic infarction of muscle, right hand: Secondary | ICD-10-CM

## 2019-03-29 NOTE — Progress Notes (Signed)
Dressing change, aquacell AG followed by guaze and ABD pad and kerlix wrap. Patient will return to office on Monday for dressing change.

## 2019-03-30 ENCOUNTER — Encounter (INDEPENDENT_AMBULATORY_CARE_PROVIDER_SITE_OTHER): Payer: Self-pay | Admitting: Nurse Practitioner

## 2019-04-02 ENCOUNTER — Encounter (INDEPENDENT_AMBULATORY_CARE_PROVIDER_SITE_OTHER): Payer: Self-pay

## 2019-04-02 ENCOUNTER — Ambulatory Visit (INDEPENDENT_AMBULATORY_CARE_PROVIDER_SITE_OTHER): Payer: Self-pay | Admitting: Nurse Practitioner

## 2019-04-02 ENCOUNTER — Other Ambulatory Visit: Payer: Self-pay

## 2019-04-02 DIAGNOSIS — M62241 Nontraumatic ischemic infarction of muscle, right hand: Secondary | ICD-10-CM

## 2019-04-02 MED ORDER — OXYCODONE-ACETAMINOPHEN 5-325 MG PO TABS: 1.0000 | ORAL_TABLET | Freq: Four times a day (QID) | ORAL | 0 refills | Status: DC | PRN

## 2019-04-02 NOTE — Progress Notes (Signed)
Patient was seen for wound dressing change.

## 2019-04-05 ENCOUNTER — Ambulatory Visit (INDEPENDENT_AMBULATORY_CARE_PROVIDER_SITE_OTHER): Payer: Self-pay | Admitting: Nurse Practitioner

## 2019-04-05 ENCOUNTER — Encounter (INDEPENDENT_AMBULATORY_CARE_PROVIDER_SITE_OTHER): Payer: Self-pay | Admitting: Nurse Practitioner

## 2019-04-05 ENCOUNTER — Other Ambulatory Visit: Payer: Self-pay

## 2019-04-05 VITALS — BP 137/88 | HR 78 | Resp 12 | Ht 70.0 in | Wt 235.0 lb

## 2019-04-05 DIAGNOSIS — M62241 Nontraumatic ischemic infarction of muscle, right hand: Secondary | ICD-10-CM

## 2019-04-05 NOTE — Progress Notes (Signed)
Patient wound on Right arm patient states is looking better. I removed dressing and replaced with Aquacel over wound covered with non-adhesive dressing covered with 2 kerlex wraps. Patient is to follow up for dressing change again Monday. AS, CMA

## 2019-04-09 ENCOUNTER — Ambulatory Visit (INDEPENDENT_AMBULATORY_CARE_PROVIDER_SITE_OTHER): Payer: Self-pay

## 2019-04-10 ENCOUNTER — Other Ambulatory Visit: Payer: Self-pay

## 2019-04-10 ENCOUNTER — Encounter (INDEPENDENT_AMBULATORY_CARE_PROVIDER_SITE_OTHER): Payer: Self-pay

## 2019-04-10 ENCOUNTER — Ambulatory Visit (INDEPENDENT_AMBULATORY_CARE_PROVIDER_SITE_OTHER): Payer: Self-pay | Admitting: Vascular Surgery

## 2019-04-10 VITALS — BP 123/87 | HR 82 | Resp 16 | Wt 235.0 lb

## 2019-04-10 DIAGNOSIS — M62241 Nontraumatic ischemic infarction of muscle, right hand: Secondary | ICD-10-CM

## 2019-04-10 NOTE — Progress Notes (Signed)
Patient came in today for dressing change with aquacell AG followed by kerlix.abd pad and ace bandage.Patient stated that he is having pain with open area I spoke with Dr Lucky Cowboy which wrote a prescription for Percocet 5-325 take 1-2 tablets by mouth every 6hours prn for pain #30. The patient will return back in the office Friday for wound dressing change

## 2019-04-12 ENCOUNTER — Ambulatory Visit (INDEPENDENT_AMBULATORY_CARE_PROVIDER_SITE_OTHER): Payer: Self-pay

## 2019-04-13 ENCOUNTER — Ambulatory Visit (INDEPENDENT_AMBULATORY_CARE_PROVIDER_SITE_OTHER): Payer: Self-pay | Admitting: Nurse Practitioner

## 2019-04-13 ENCOUNTER — Other Ambulatory Visit: Payer: Self-pay

## 2019-04-13 ENCOUNTER — Encounter (INDEPENDENT_AMBULATORY_CARE_PROVIDER_SITE_OTHER): Payer: Self-pay | Admitting: Nurse Practitioner

## 2019-04-13 VITALS — BP 120/87 | HR 71 | Resp 17 | Ht 70.0 in | Wt 238.0 lb

## 2019-04-13 DIAGNOSIS — I96 Gangrene, not elsewhere classified: Secondary | ICD-10-CM

## 2019-04-13 NOTE — Progress Notes (Signed)
Dressing change.

## 2019-04-16 ENCOUNTER — Ambulatory Visit (INDEPENDENT_AMBULATORY_CARE_PROVIDER_SITE_OTHER): Payer: Self-pay

## 2019-04-17 ENCOUNTER — Encounter (INDEPENDENT_AMBULATORY_CARE_PROVIDER_SITE_OTHER): Payer: Self-pay | Admitting: Nurse Practitioner

## 2019-04-17 ENCOUNTER — Ambulatory Visit (INDEPENDENT_AMBULATORY_CARE_PROVIDER_SITE_OTHER): Payer: Self-pay | Admitting: Nurse Practitioner

## 2019-04-17 ENCOUNTER — Other Ambulatory Visit: Payer: Self-pay

## 2019-04-17 VITALS — BP 138/87 | HR 82 | Resp 16 | Ht 70.0 in | Wt 237.0 lb

## 2019-04-17 DIAGNOSIS — M62241 Nontraumatic ischemic infarction of muscle, right hand: Secondary | ICD-10-CM

## 2019-04-17 NOTE — Progress Notes (Signed)
Wound check on right arm

## 2019-04-19 ENCOUNTER — Ambulatory Visit (INDEPENDENT_AMBULATORY_CARE_PROVIDER_SITE_OTHER): Payer: Self-pay

## 2019-04-20 ENCOUNTER — Encounter (INDEPENDENT_AMBULATORY_CARE_PROVIDER_SITE_OTHER): Payer: Self-pay | Admitting: Nurse Practitioner

## 2019-04-20 ENCOUNTER — Other Ambulatory Visit: Payer: Self-pay

## 2019-04-20 ENCOUNTER — Ambulatory Visit (INDEPENDENT_AMBULATORY_CARE_PROVIDER_SITE_OTHER): Payer: Self-pay | Admitting: Nurse Practitioner

## 2019-04-20 VITALS — BP 127/80 | HR 82 | Resp 16 | Ht 70.0 in | Wt 236.0 lb

## 2019-04-20 DIAGNOSIS — M62241 Nontraumatic ischemic infarction of muscle, right hand: Secondary | ICD-10-CM

## 2019-04-20 NOTE — Progress Notes (Signed)
Wound check right arm

## 2019-04-23 ENCOUNTER — Telehealth (INDEPENDENT_AMBULATORY_CARE_PROVIDER_SITE_OTHER): Payer: Self-pay

## 2019-04-23 ENCOUNTER — Other Ambulatory Visit (INDEPENDENT_AMBULATORY_CARE_PROVIDER_SITE_OTHER): Payer: Self-pay | Admitting: Nurse Practitioner

## 2019-04-23 ENCOUNTER — Ambulatory Visit (INDEPENDENT_AMBULATORY_CARE_PROVIDER_SITE_OTHER): Payer: Self-pay

## 2019-04-23 DIAGNOSIS — M62241 Nontraumatic ischemic infarction of muscle, right hand: Secondary | ICD-10-CM

## 2019-04-23 MED ORDER — OXYCODONE-ACETAMINOPHEN 5-325 MG PO TABS: 1.0000 | ORAL_TABLET | Freq: Four times a day (QID) | ORAL | 0 refills | Status: DC | PRN

## 2019-04-23 NOTE — Telephone Encounter (Signed)
We will hold on antibiotics until we see his arm tomorrow to see if he needs more but we will send in more pain medication today

## 2019-04-23 NOTE — Telephone Encounter (Signed)
I left a detail message on patient voicemail with medical advice

## 2019-04-24 ENCOUNTER — Ambulatory Visit (INDEPENDENT_AMBULATORY_CARE_PROVIDER_SITE_OTHER): Payer: Self-pay | Admitting: Nurse Practitioner

## 2019-04-24 ENCOUNTER — Other Ambulatory Visit: Payer: Self-pay

## 2019-04-24 ENCOUNTER — Encounter (INDEPENDENT_AMBULATORY_CARE_PROVIDER_SITE_OTHER): Payer: Self-pay

## 2019-04-24 VITALS — BP 121/80 | HR 71 | Resp 16 | Wt 236.6 lb

## 2019-04-24 DIAGNOSIS — M62241 Nontraumatic ischemic infarction of muscle, right hand: Secondary | ICD-10-CM

## 2019-04-24 NOTE — Progress Notes (Signed)
Patient came in today for wound dressing change for the right arm and will return on Friday for next visit

## 2019-04-26 ENCOUNTER — Ambulatory Visit (INDEPENDENT_AMBULATORY_CARE_PROVIDER_SITE_OTHER): Payer: Self-pay

## 2019-04-27 ENCOUNTER — Other Ambulatory Visit: Payer: Self-pay

## 2019-04-27 ENCOUNTER — Ambulatory Visit (INDEPENDENT_AMBULATORY_CARE_PROVIDER_SITE_OTHER): Payer: Self-pay | Admitting: Nurse Practitioner

## 2019-04-27 ENCOUNTER — Encounter (INDEPENDENT_AMBULATORY_CARE_PROVIDER_SITE_OTHER): Payer: Self-pay

## 2019-04-27 VITALS — BP 140/90 | HR 87 | Resp 16 | Wt 240.8 lb

## 2019-04-27 DIAGNOSIS — M62241 Nontraumatic ischemic infarction of muscle, right hand: Secondary | ICD-10-CM

## 2019-04-27 NOTE — Progress Notes (Signed)
Patient came in for wound dressing change for right arm and will return back in the office on Tuesday

## 2019-04-29 ENCOUNTER — Encounter (INDEPENDENT_AMBULATORY_CARE_PROVIDER_SITE_OTHER): Payer: Self-pay | Admitting: Nurse Practitioner

## 2019-05-01 ENCOUNTER — Other Ambulatory Visit: Payer: Self-pay

## 2019-05-01 ENCOUNTER — Ambulatory Visit (INDEPENDENT_AMBULATORY_CARE_PROVIDER_SITE_OTHER): Payer: Self-pay | Admitting: Nurse Practitioner

## 2019-05-01 ENCOUNTER — Encounter (INDEPENDENT_AMBULATORY_CARE_PROVIDER_SITE_OTHER): Payer: Self-pay | Admitting: Nurse Practitioner

## 2019-05-01 VITALS — BP 128/85 | HR 73 | Resp 17 | Ht 70.0 in | Wt 237.0 lb

## 2019-05-01 DIAGNOSIS — M62241 Nontraumatic ischemic infarction of muscle, right hand: Secondary | ICD-10-CM

## 2019-05-01 NOTE — Progress Notes (Signed)
Wound dressing change

## 2019-05-04 ENCOUNTER — Ambulatory Visit (INDEPENDENT_AMBULATORY_CARE_PROVIDER_SITE_OTHER): Payer: Self-pay

## 2019-05-07 ENCOUNTER — Other Ambulatory Visit (INDEPENDENT_AMBULATORY_CARE_PROVIDER_SITE_OTHER): Payer: Self-pay | Admitting: Nurse Practitioner

## 2019-05-07 ENCOUNTER — Telehealth (INDEPENDENT_AMBULATORY_CARE_PROVIDER_SITE_OTHER): Payer: Self-pay

## 2019-05-07 DIAGNOSIS — M62241 Nontraumatic ischemic infarction of muscle, right hand: Secondary | ICD-10-CM

## 2019-05-08 ENCOUNTER — Other Ambulatory Visit (INDEPENDENT_AMBULATORY_CARE_PROVIDER_SITE_OTHER): Payer: Self-pay | Admitting: Nurse Practitioner

## 2019-05-08 ENCOUNTER — Encounter (INDEPENDENT_AMBULATORY_CARE_PROVIDER_SITE_OTHER): Payer: Self-pay

## 2019-05-08 ENCOUNTER — Ambulatory Visit (INDEPENDENT_AMBULATORY_CARE_PROVIDER_SITE_OTHER): Payer: Self-pay | Admitting: Vascular Surgery

## 2019-05-08 ENCOUNTER — Other Ambulatory Visit: Payer: Self-pay

## 2019-05-08 VITALS — BP 134/83 | HR 83 | Resp 16 | Wt 233.0 lb

## 2019-05-08 DIAGNOSIS — M62241 Nontraumatic ischemic infarction of muscle, right hand: Secondary | ICD-10-CM

## 2019-05-08 MED ORDER — OXYCODONE-ACETAMINOPHEN 5-325 MG PO TABS: 1.0000 | ORAL_TABLET | Freq: Three times a day (TID) | ORAL | 0 refills | Status: AC | PRN

## 2019-05-08 NOTE — Telephone Encounter (Signed)
I have sent the patient in some pain medication however since his wound is getting much smaller and nearly healed, it is time to start tapering to come off of the medication. I have adjust the frequency of his pain medication just so he is aware.

## 2019-05-08 NOTE — Progress Notes (Signed)
Patient came in for right arm dressing change

## 2019-05-08 NOTE — Telephone Encounter (Signed)
Patient has been made aware and verbalized understanding.  

## 2019-05-11 ENCOUNTER — Other Ambulatory Visit: Payer: Self-pay

## 2019-05-11 ENCOUNTER — Encounter (INDEPENDENT_AMBULATORY_CARE_PROVIDER_SITE_OTHER): Payer: Self-pay

## 2019-05-11 ENCOUNTER — Ambulatory Visit (INDEPENDENT_AMBULATORY_CARE_PROVIDER_SITE_OTHER): Payer: Self-pay | Admitting: Nurse Practitioner

## 2019-05-11 VITALS — BP 138/86 | HR 91 | Resp 16 | Wt 232.8 lb

## 2019-05-11 DIAGNOSIS — M62241 Nontraumatic ischemic infarction of muscle, right hand: Secondary | ICD-10-CM

## 2019-05-11 NOTE — Progress Notes (Signed)
Patient came for right arm wound dressing change and I applied Aquacel,abd pad, kerlix, and ace bandage.

## 2019-05-15 ENCOUNTER — Encounter (INDEPENDENT_AMBULATORY_CARE_PROVIDER_SITE_OTHER): Payer: Self-pay

## 2019-05-15 ENCOUNTER — Other Ambulatory Visit: Payer: Self-pay

## 2019-05-15 ENCOUNTER — Ambulatory Visit (INDEPENDENT_AMBULATORY_CARE_PROVIDER_SITE_OTHER): Payer: Self-pay | Admitting: Nurse Practitioner

## 2019-05-15 VITALS — BP 130/83 | HR 96 | Resp 16 | Wt 226.0 lb

## 2019-05-15 DIAGNOSIS — M62241 Nontraumatic ischemic infarction of muscle, right hand: Secondary | ICD-10-CM

## 2019-05-15 NOTE — Progress Notes (Signed)
Patient came for right arm wound dressing change and I applied Aquacel,abd pad, kerlix, and ace bandage. 

## 2019-05-16 ENCOUNTER — Ambulatory Visit (INDEPENDENT_AMBULATORY_CARE_PROVIDER_SITE_OTHER): Payer: Self-pay

## 2019-05-21 ENCOUNTER — Encounter (INDEPENDENT_AMBULATORY_CARE_PROVIDER_SITE_OTHER): Payer: Self-pay | Admitting: Nurse Practitioner

## 2019-05-22 ENCOUNTER — Ambulatory Visit (INDEPENDENT_AMBULATORY_CARE_PROVIDER_SITE_OTHER): Payer: Self-pay

## 2019-05-24 ENCOUNTER — Telehealth (INDEPENDENT_AMBULATORY_CARE_PROVIDER_SITE_OTHER): Payer: Self-pay | Admitting: Vascular Surgery

## 2019-05-24 NOTE — Telephone Encounter (Signed)
Patient will be giving 1 month samples Eliquis 5mg  and has been made aware that we will not be able to refill his pain medication. I also informed the [facility] to [facility] with medication.

## 2019-05-24 NOTE — Telephone Encounter (Signed)
As per Fallon's last note, the patient was given his last pain prescription last time he asked on 05/07/19.

## 2019-05-25 ENCOUNTER — Other Ambulatory Visit: Payer: Self-pay

## 2019-05-25 ENCOUNTER — Encounter (INDEPENDENT_AMBULATORY_CARE_PROVIDER_SITE_OTHER): Payer: Self-pay

## 2019-05-25 ENCOUNTER — Ambulatory Visit (INDEPENDENT_AMBULATORY_CARE_PROVIDER_SITE_OTHER): Payer: Self-pay | Admitting: Nurse Practitioner

## 2019-05-25 VITALS — BP 134/87 | HR 99 | Resp 16 | Wt 224.8 lb

## 2019-05-25 DIAGNOSIS — M62241 Nontraumatic ischemic infarction of muscle, right hand: Secondary | ICD-10-CM

## 2019-05-25 NOTE — Progress Notes (Signed)
Patient came in today for right arm dressing change

## 2019-05-29 ENCOUNTER — Other Ambulatory Visit (INDEPENDENT_AMBULATORY_CARE_PROVIDER_SITE_OTHER): Payer: Self-pay | Admitting: Nurse Practitioner

## 2019-05-29 ENCOUNTER — Telehealth (INDEPENDENT_AMBULATORY_CARE_PROVIDER_SITE_OTHER): Payer: Self-pay

## 2019-05-29 ENCOUNTER — Other Ambulatory Visit: Payer: Self-pay

## 2019-05-29 ENCOUNTER — Encounter (INDEPENDENT_AMBULATORY_CARE_PROVIDER_SITE_OTHER): Payer: Self-pay | Admitting: Nurse Practitioner

## 2019-05-29 ENCOUNTER — Ambulatory Visit (INDEPENDENT_AMBULATORY_CARE_PROVIDER_SITE_OTHER): Payer: Self-pay | Admitting: Nurse Practitioner

## 2019-05-29 VITALS — BP 114/68 | HR 102 | Resp 18 | Ht 70.0 in | Wt 228.0 lb

## 2019-05-29 DIAGNOSIS — M62241 Nontraumatic ischemic infarction of muscle, right hand: Secondary | ICD-10-CM

## 2019-05-29 MED ORDER — OXYCODONE-ACETAMINOPHEN 5-325 MG PO TABS: 1.0000 | ORAL_TABLET | Freq: Three times a day (TID) | ORAL | 0 refills | Status: AC | PRN

## 2019-05-29 NOTE — Telephone Encounter (Signed)
Refill sent.

## 2019-05-29 NOTE — Progress Notes (Signed)
Wound check

## 2019-05-29 NOTE — Telephone Encounter (Signed)
Patient was seen this morning and asking about more pain medication. Patient stated that the Ibuprofen is not touching his pain at all.

## 2019-05-29 NOTE — Telephone Encounter (Signed)
Spoke with the patient and let him know that the refill has been sent to the patient's pharmacy.

## 2019-06-01 ENCOUNTER — Ambulatory Visit (INDEPENDENT_AMBULATORY_CARE_PROVIDER_SITE_OTHER): Payer: Self-pay

## 2019-06-05 ENCOUNTER — Ambulatory Visit (INDEPENDENT_AMBULATORY_CARE_PROVIDER_SITE_OTHER): Payer: Self-pay

## 2019-06-07 ENCOUNTER — Ambulatory Visit (INDEPENDENT_AMBULATORY_CARE_PROVIDER_SITE_OTHER): Payer: Self-pay

## 2019-06-07 ENCOUNTER — Encounter (INDEPENDENT_AMBULATORY_CARE_PROVIDER_SITE_OTHER): Payer: Self-pay

## 2019-06-07 IMAGING — US US EXTREM  UP VENOUS*R*
1 series · 13 of 24 positions shown · non-contrast
Comparison: Right shoulder radiographs-earlier same day

CLINICAL DATA: Right upper extremity pain and edema. History of
smoking. Evaluate for DVT.



[Series 1: us extrem up venous*right* · 13 of 32 slices shown]
[im 1/32]
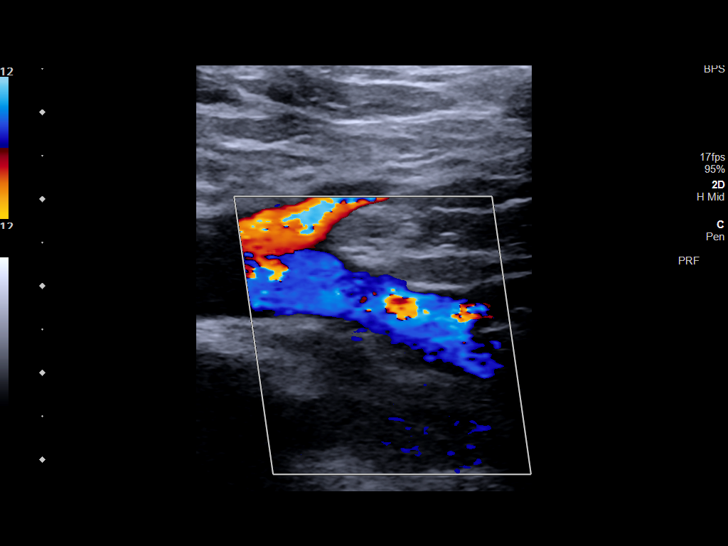
[im 3/32]
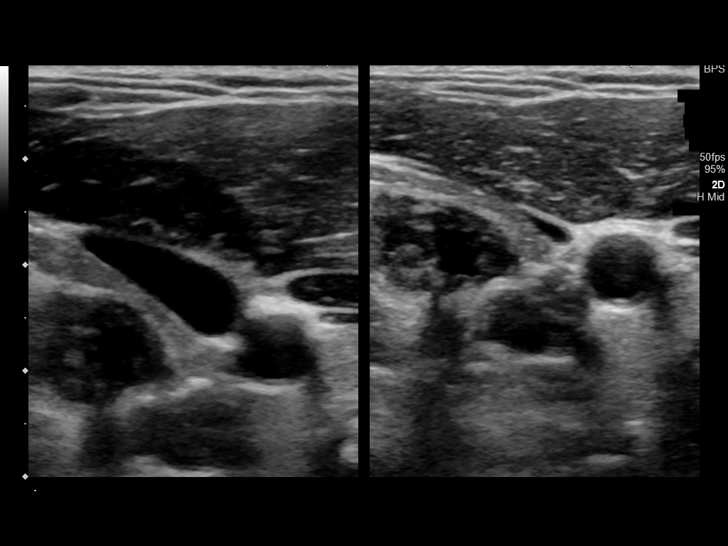
[im 6/32]
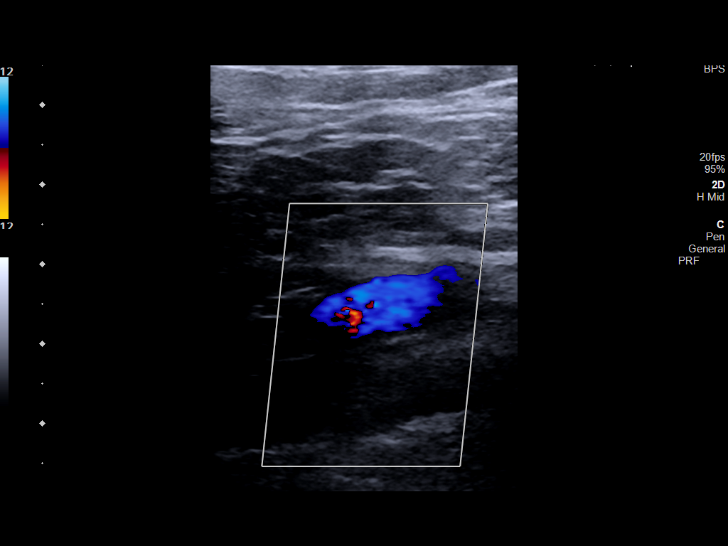
[im 9/32]
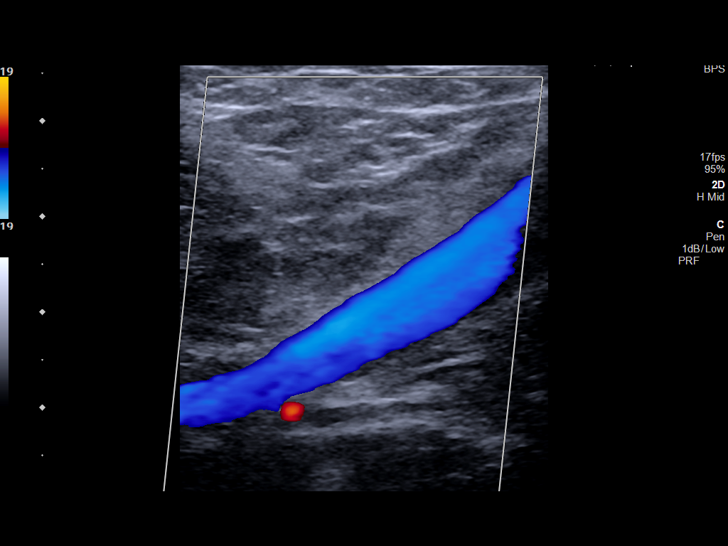
[im 11/32]
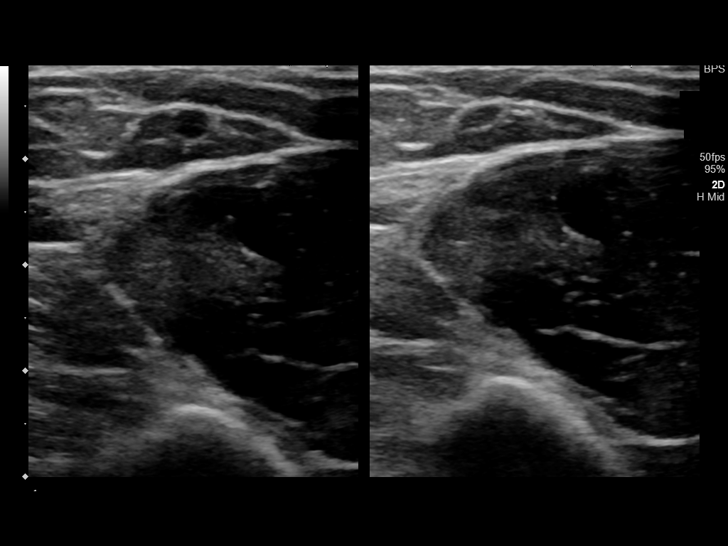
[im 14/32]
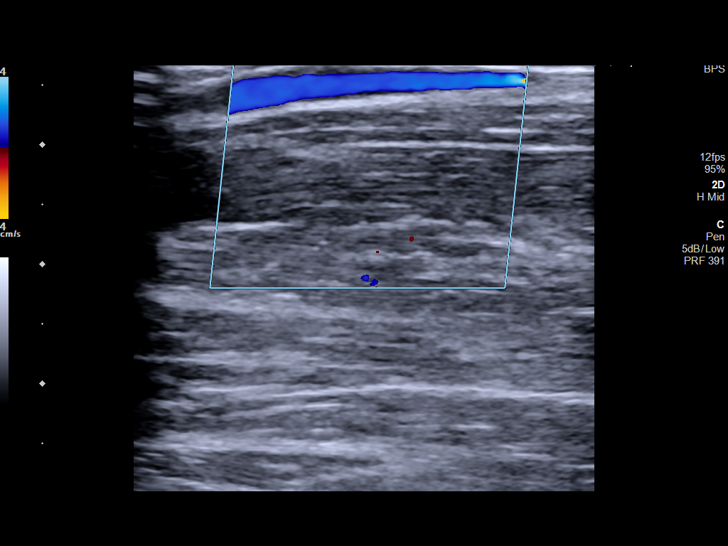
[im 17/32]
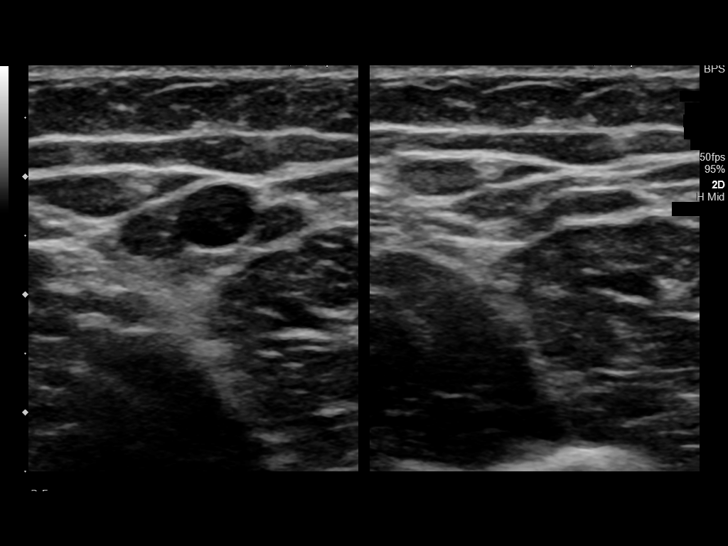
[im 18/32]
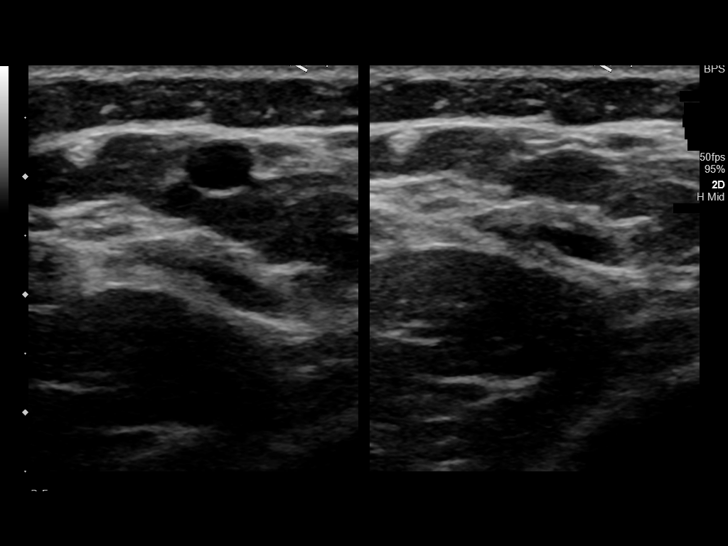
[im 21/32]
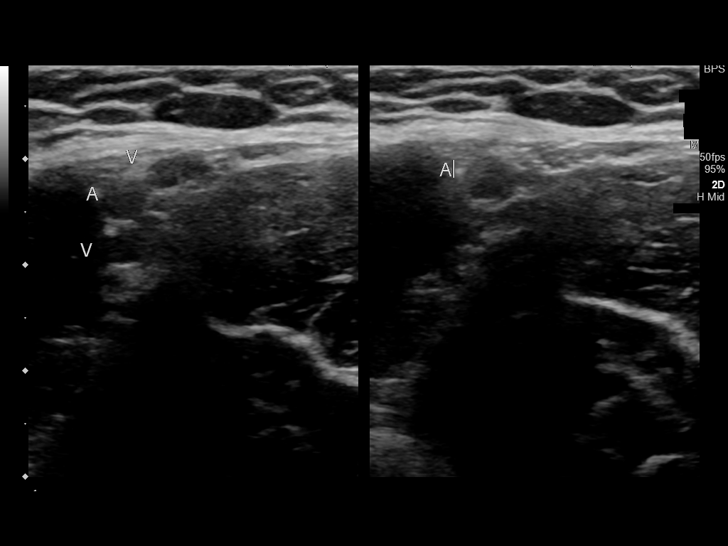
[im 23/32]
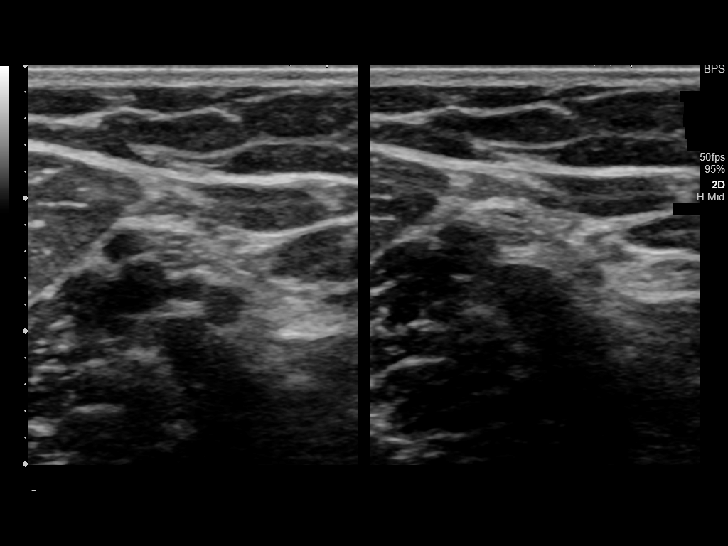
[im 26/32]
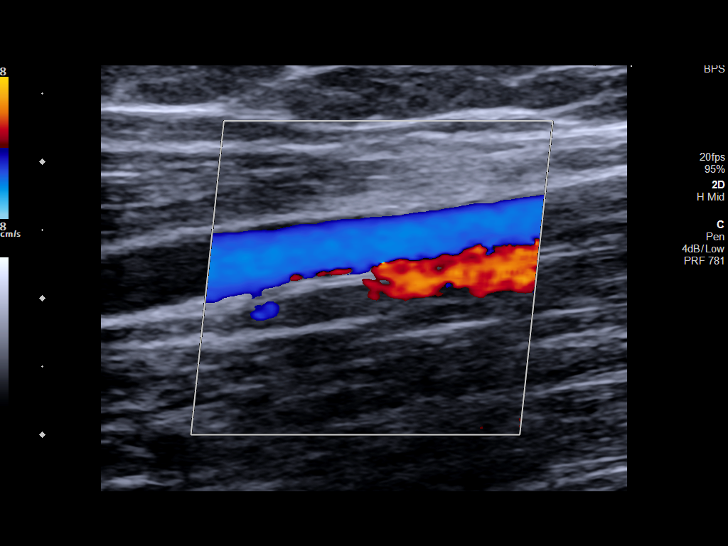
[im 29/32]
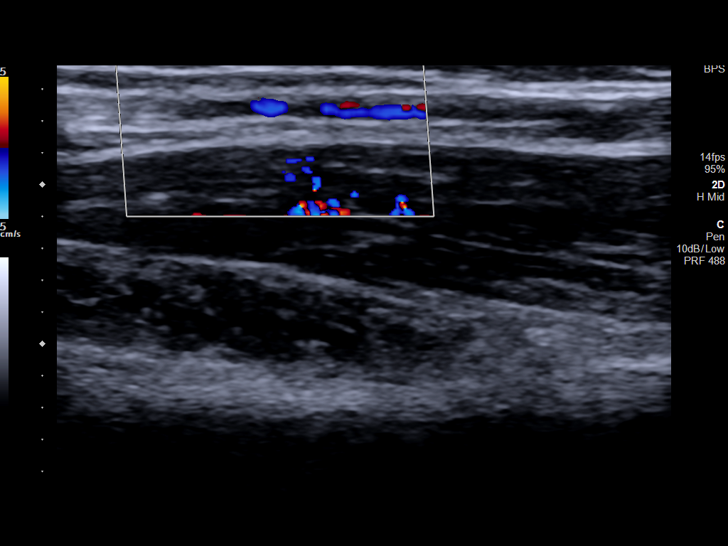
[im 32/32]
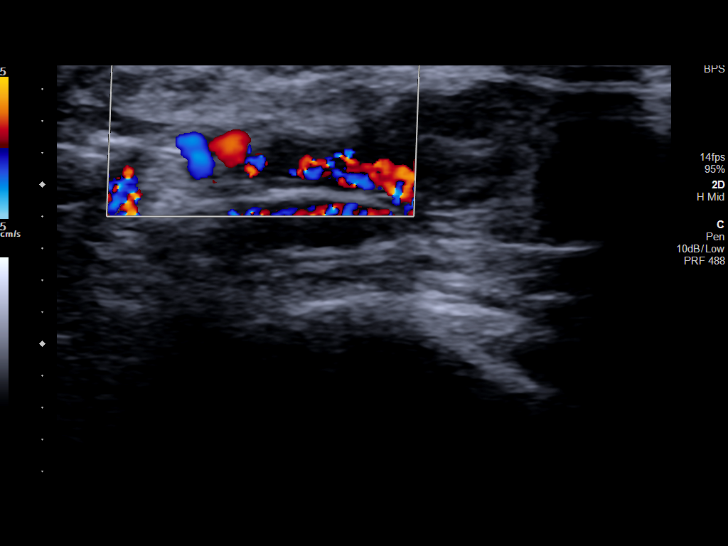

[13 of 24 positions shown; findings below may reference images not displayed]

FINDINGS: Contralateral Subclavian Vein: Respiratory phasicity is normal and
symmetric with the symptomatic side. No evidence of thrombus. Normal
compressibility.

Internal Jugular Vein: No evidence of thrombus. Normal
compressibility, respiratory phasicity and response to augmentation.

Subclavian Vein: No evidence of thrombus. Normal compressibility,
respiratory phasicity and response to augmentation.

Axillary Vein: No evidence of thrombus. Normal compressibility,
respiratory phasicity and response to augmentation.

Cephalic Vein: No evidence of thrombus. Normal compressibility,
respiratory phasicity and response to augmentation.

Basilic Vein: No evidence of thrombus. Normal compressibility,
respiratory phasicity and response to augmentation.

Brachial Veins: No evidence of thrombus. Normal compressibility,
respiratory phasicity and response to augmentation.

Radial Veins: No evidence of thrombus. Normal compressibility,
respiratory phasicity and response to augmentation.

Ulnar Veins: No evidence of thrombus. Normal compressibility,
respiratory phasicity and response to augmentation.

Venous Reflux:  None visualized.

Other Findings:  None visualized.
IMPRESSION: No evidence of DVT within the right upper extremity.

## 2019-06-07 IMAGING — CR DG SHOULDER 2+V*R*
1 series · 3 of 3 positions shown · non-contrast
Comparison: No recent.

CLINICAL DATA: Right arm and shoulder pain.

EXAM:
RIGHT SHOULDER - 2+ VIEW

[Series 1: dg shoulder right · 0.14mm/px · 3 of 3 slices shown]
[im 1/3]
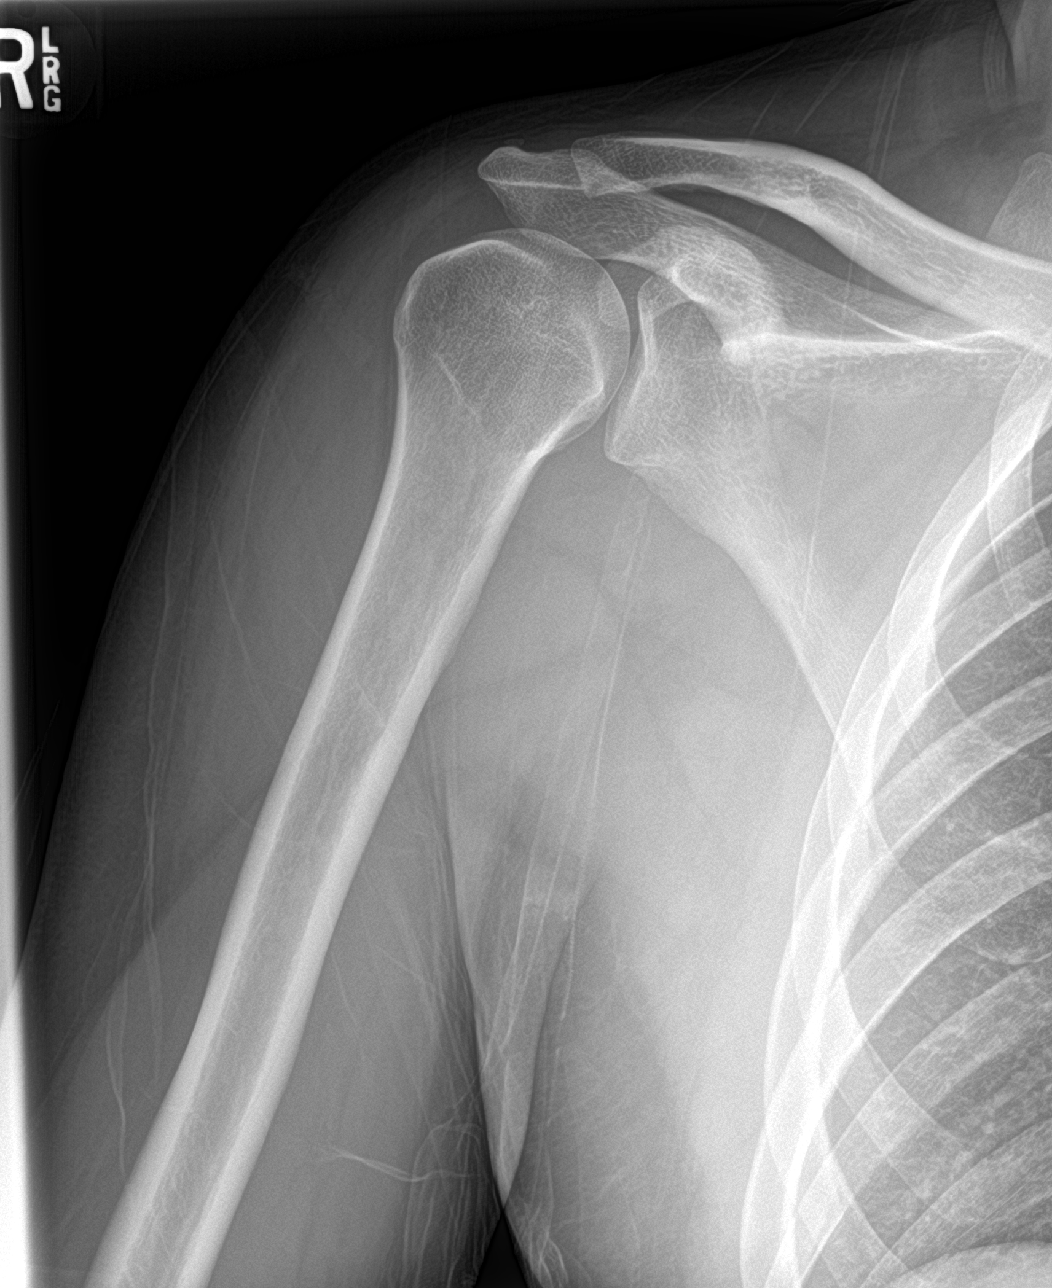
[im 2/3]
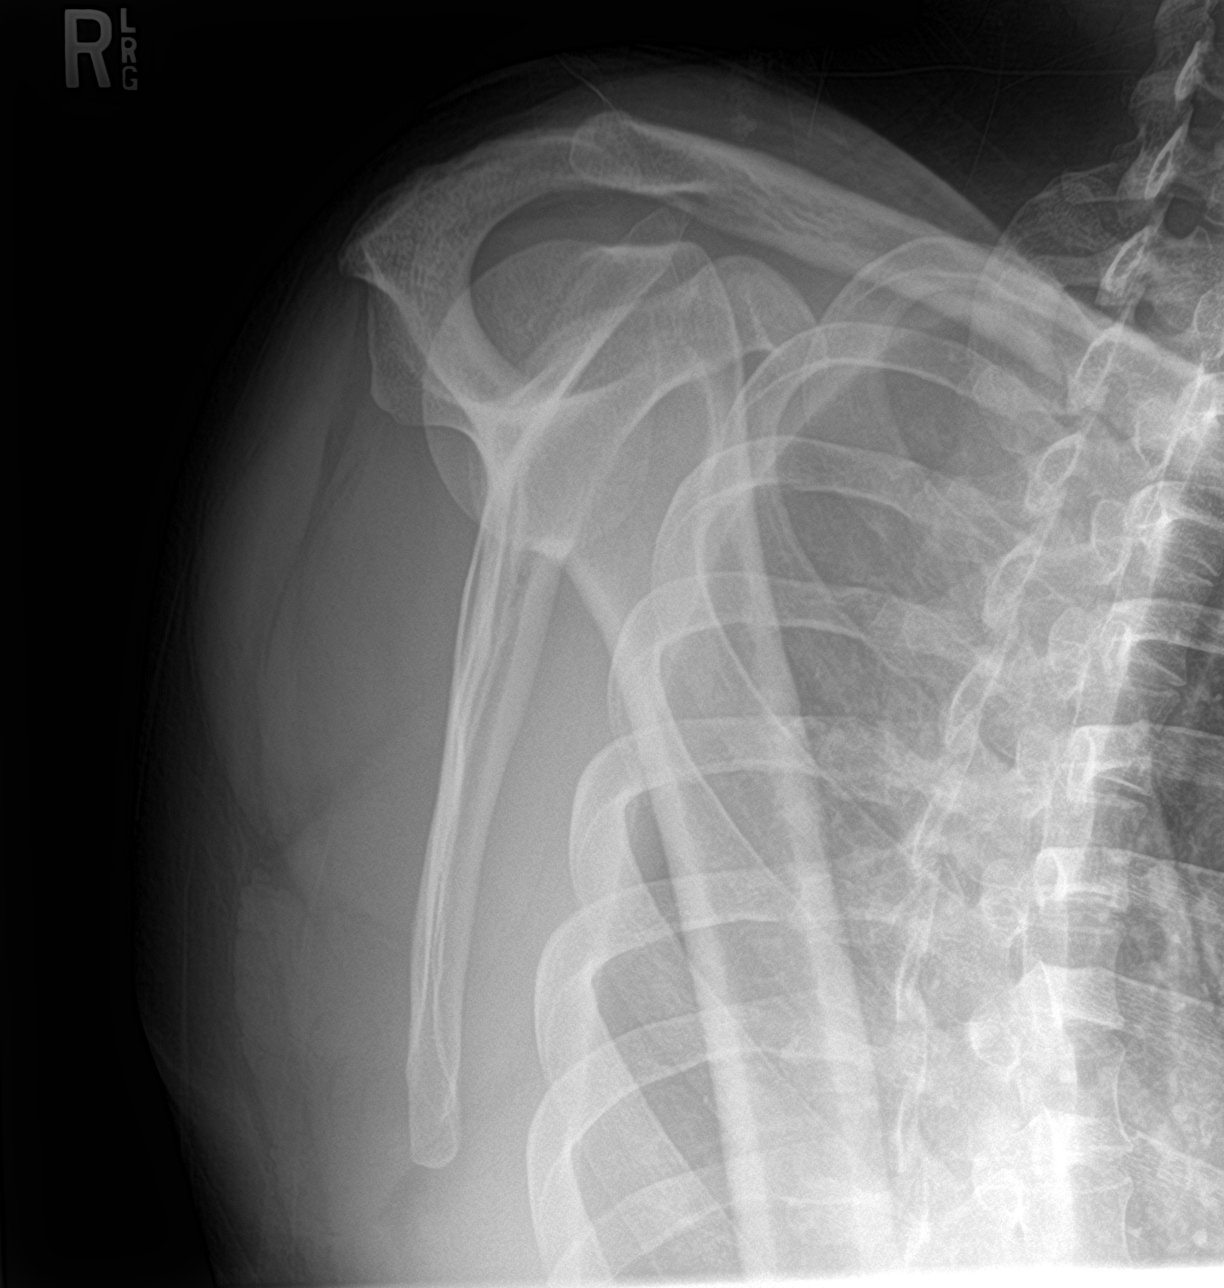
[im 3/3]
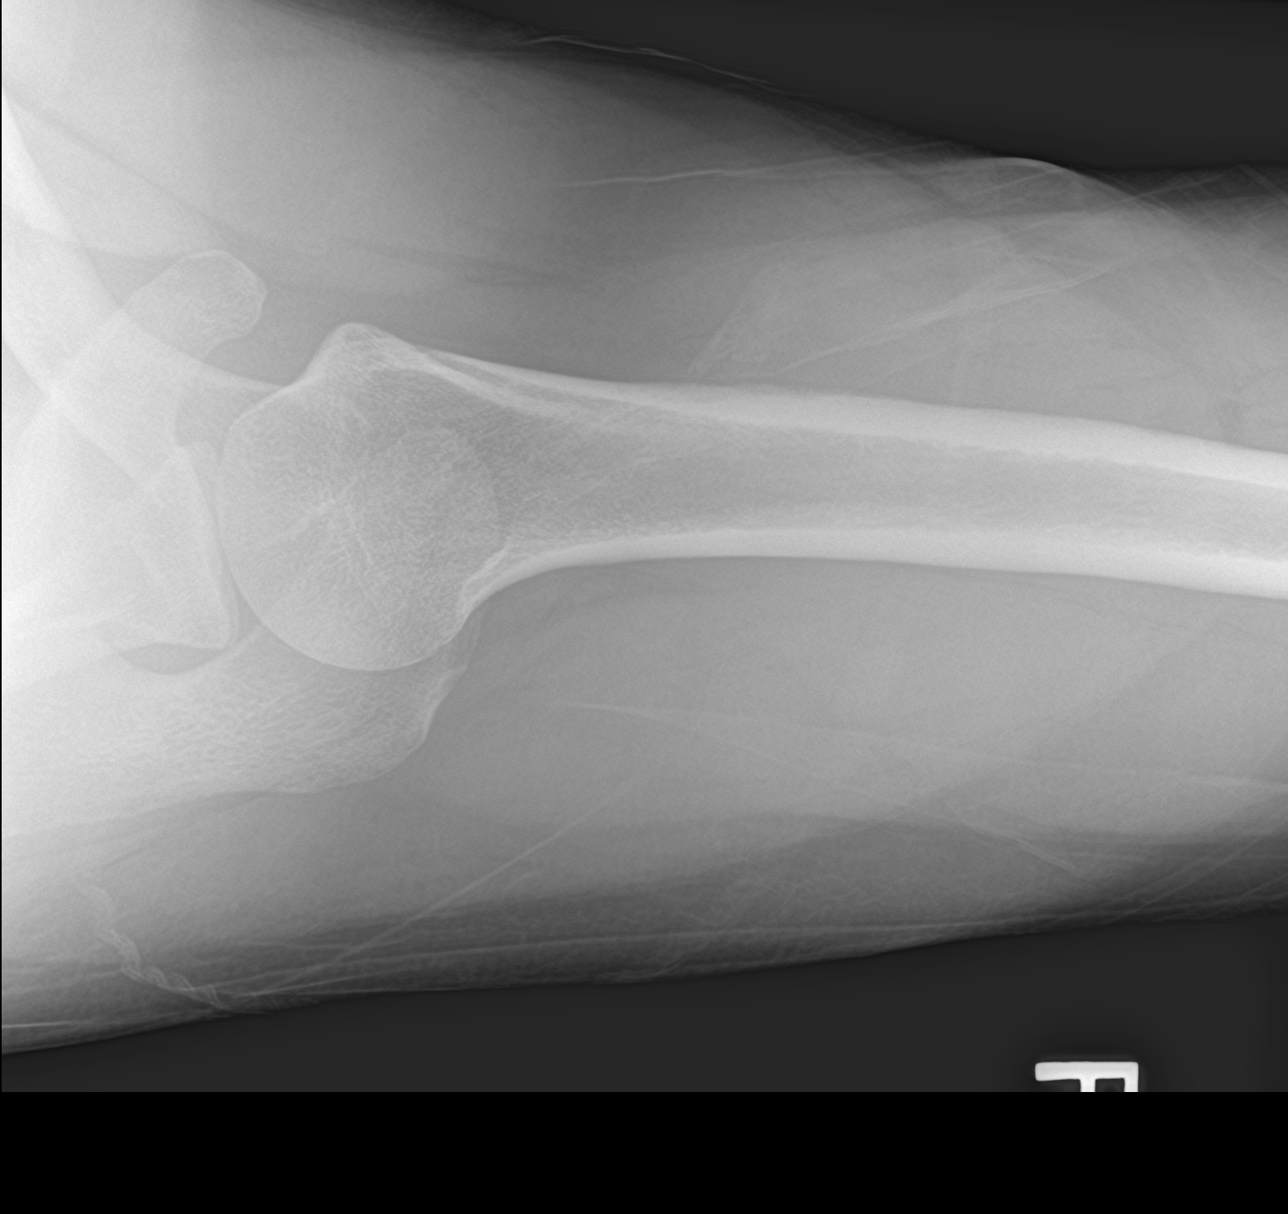

[3 of 3 positions shown; findings below may reference images not displayed]

FINDINGS: No acute bony or joint abnormality identified. No evidence of
fracture or dislocation.
IMPRESSION: No acute abnormality.

## 2019-06-12 ENCOUNTER — Ambulatory Visit (INDEPENDENT_AMBULATORY_CARE_PROVIDER_SITE_OTHER): Payer: Self-pay

## 2019-10-05 IMAGING — US RIGHT UPPER EXTREMITY VENOUS ULTRASOUND
1 series · 14 of 24 positions shown · non-contrast
Comparison: 07/06/2018

CLINICAL DATA: Right arm paresthesia, coldness

EXAM:
RIGHT UPPER EXTREMITY VENOUS DOPPLER ULTRASOUND
TECHNIQUE: Gray-scale sonography with graded compression, as well as color
Doppler and duplex ultrasound were performed to evaluate the upper
extremity deep venous system from the level of the subclavian vein
and including the jugular, axillary, basilic and upper cephalic
vein. Spectral Doppler was utilized to evaluate flow at rest and
with distal augmentation maneuvers.

[Series 1: right upper extremity venous ultrasound · 14 of 28 slices shown]
[im 1/28]
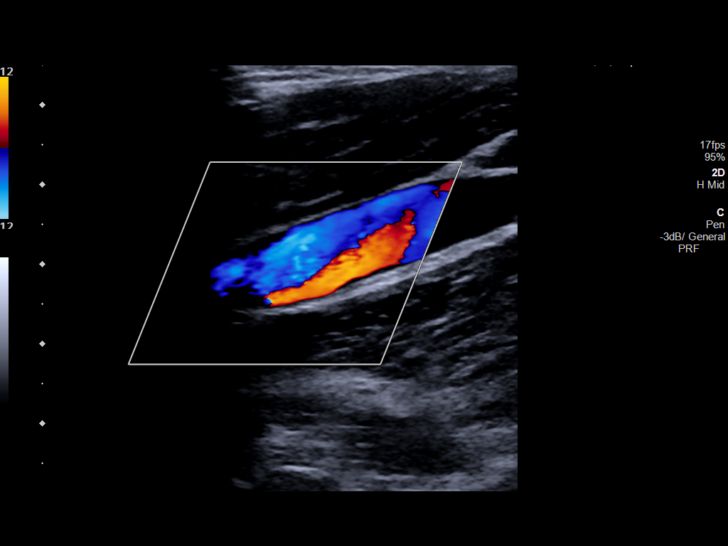
[im 3/28]
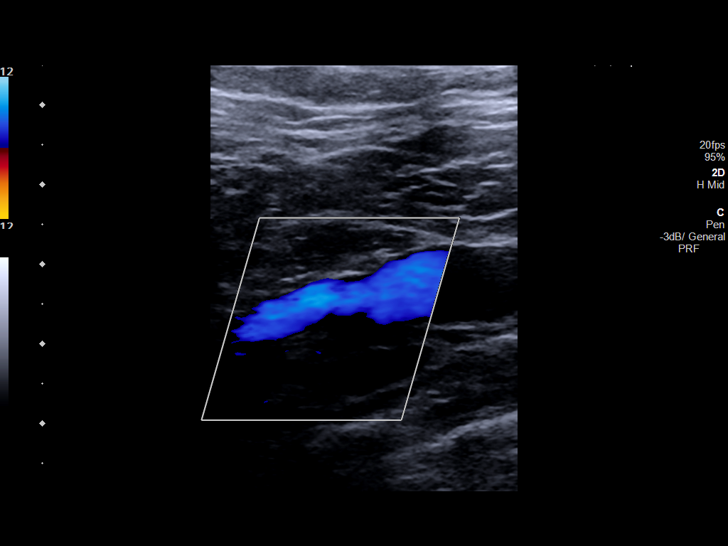
[im 5/28]
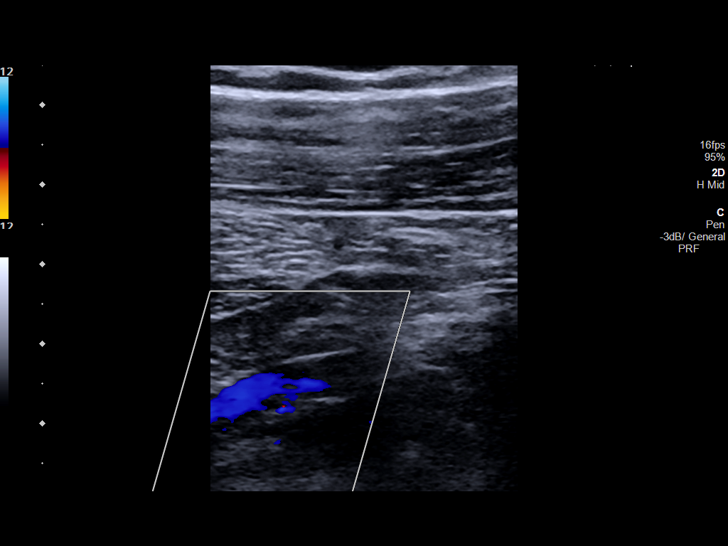
[im 8/28]
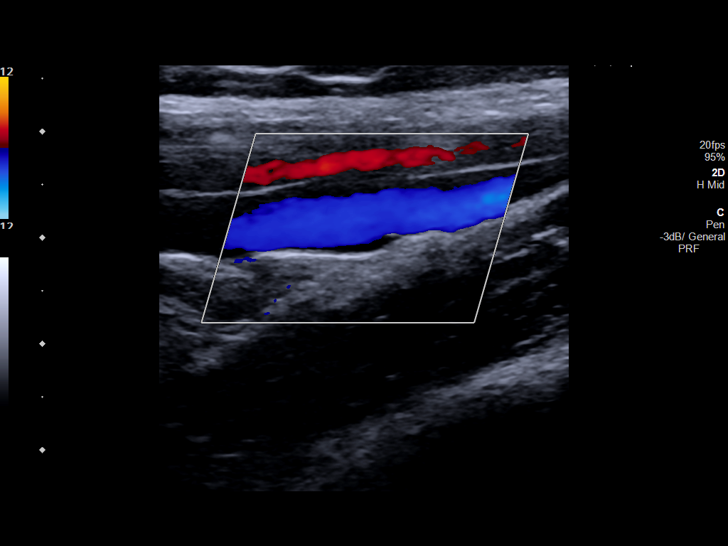
[im 9/28]
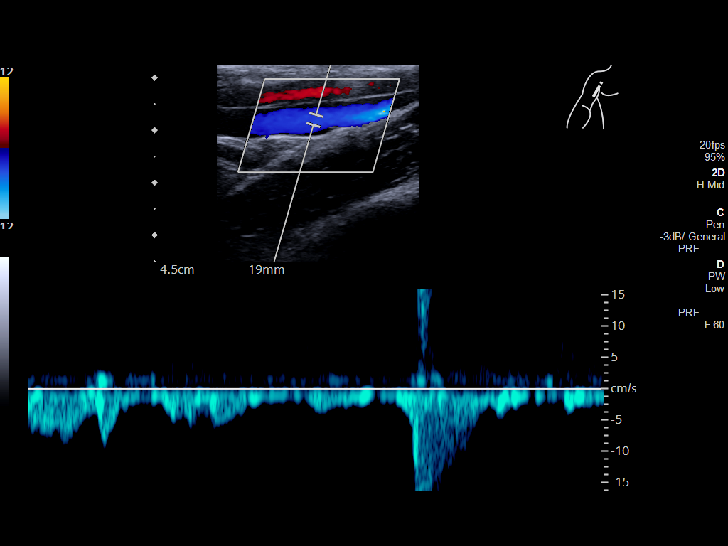
[im 11/28]
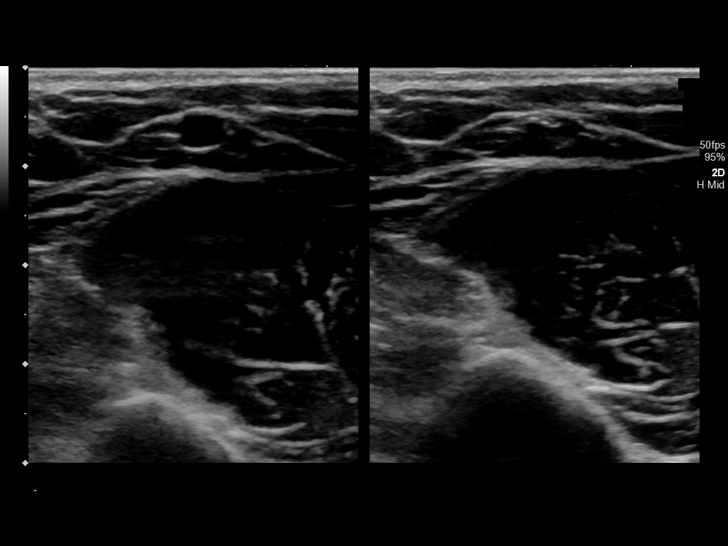
[im 13/28]
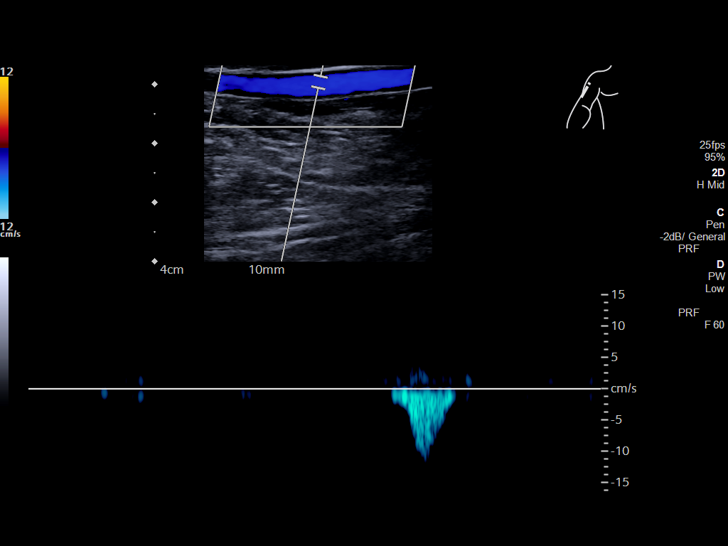
[im 15/28]
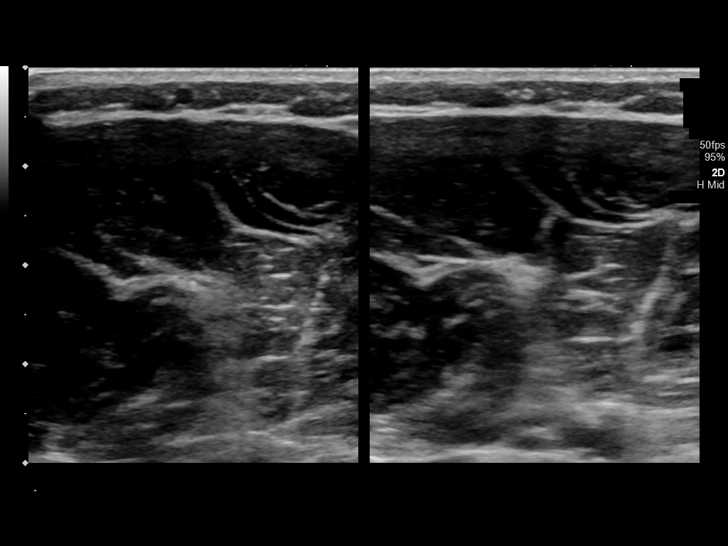
[im 17/28]
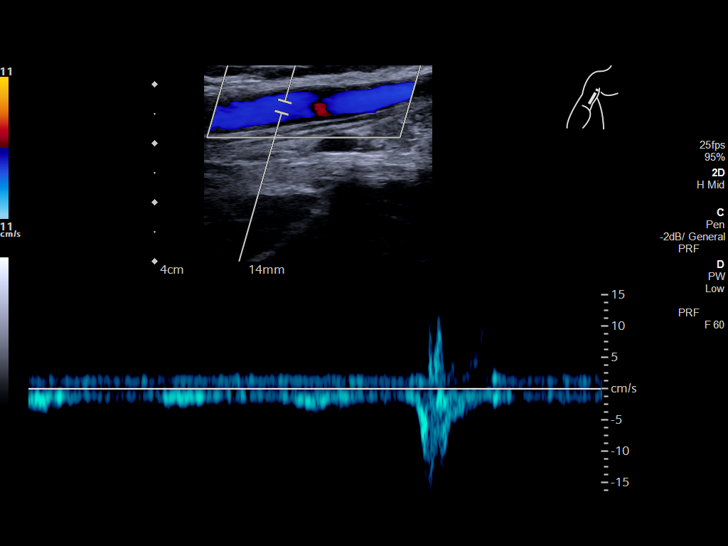
[im 19/28]
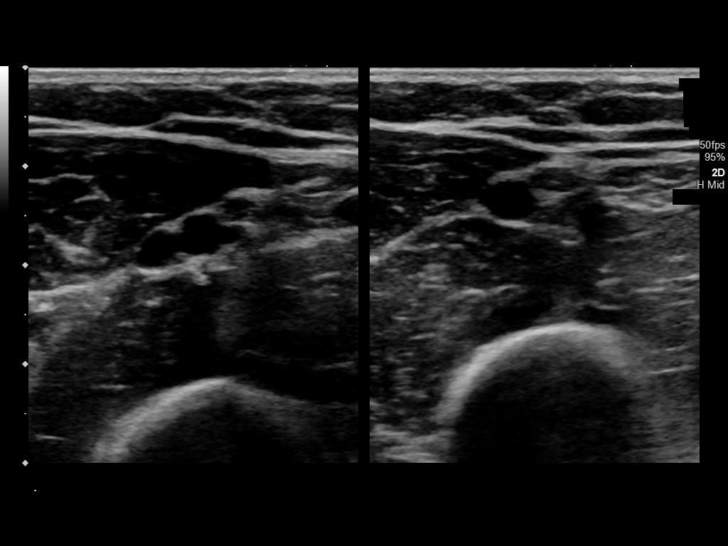
[im 22/28]
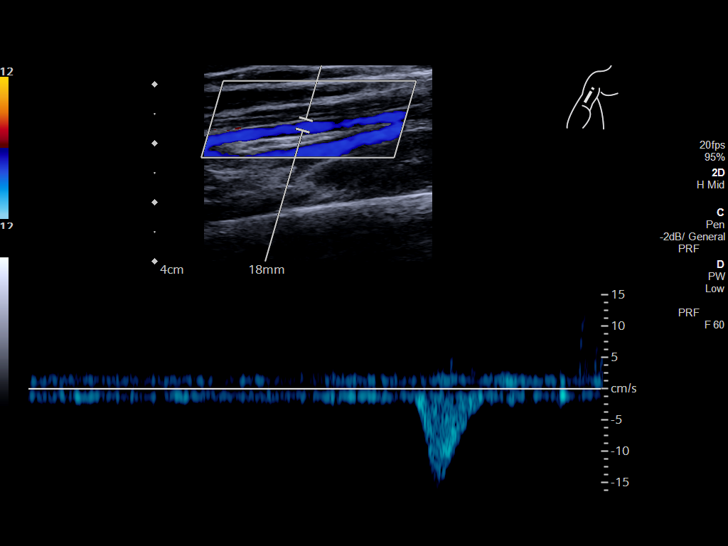
[im 23/28]
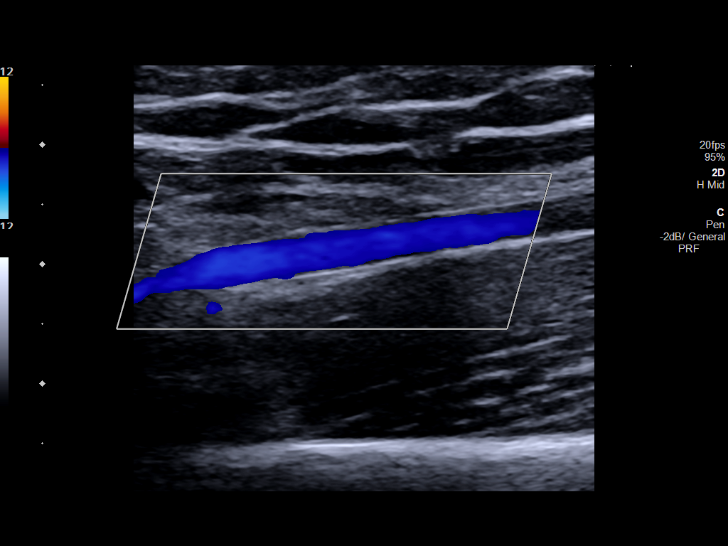
[im 25/28]
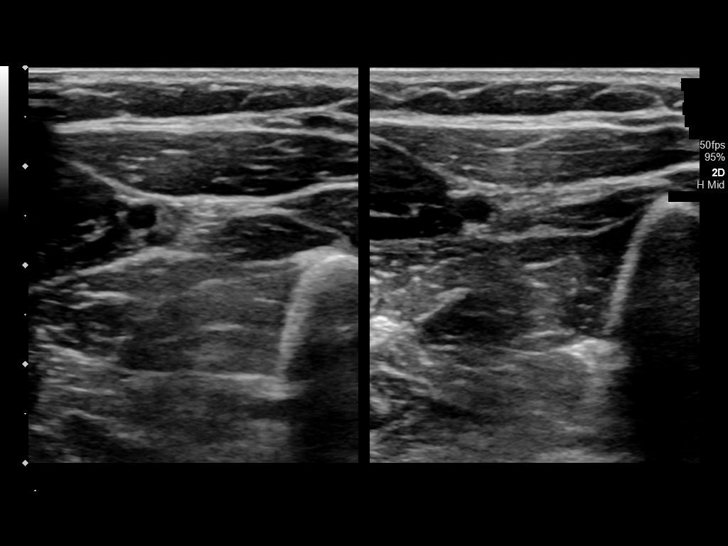
[im 28/28]
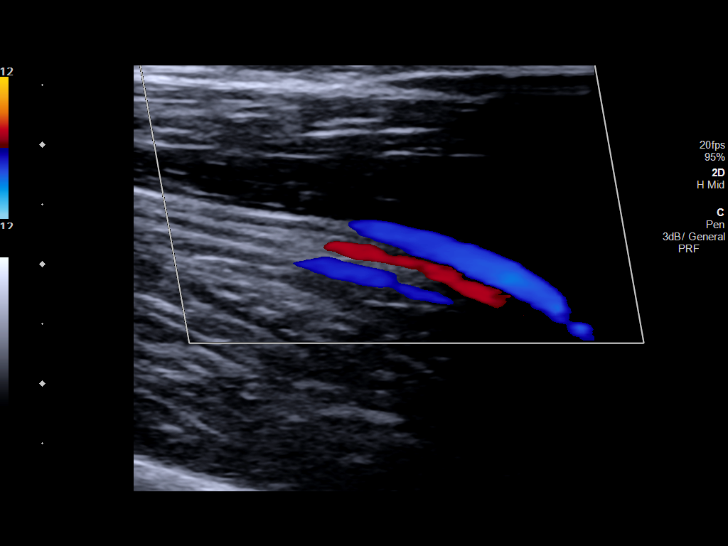

[14 of 24 positions shown; findings below may reference images not displayed]

FINDINGS: Thrombus within deep veins:  None visualized.

Compressibility of deep veins:  Normal.

Duplex waveform respiratory phasicity:  Normal.

Duplex waveform response to augmentation:  Normal.

Venous reflux:  None visualized.

Other findings:  None visualized.
IMPRESSION: Negative for right upper extremity DVT.

## 2019-10-15 IMAGING — CR CERVICAL SPINE - 2-3 VIEW
1 series · 5 of 5 positions shown · non-contrast
Comparison: None.

CLINICAL DATA: Left arm tingling.

EXAM:
CERVICAL SPINE - 2-3 VIEW

[Series 1: dg cervical spine 2 or 3 views · 0.14mm/px · 5 of 5 slices shown]
[im 1/5]
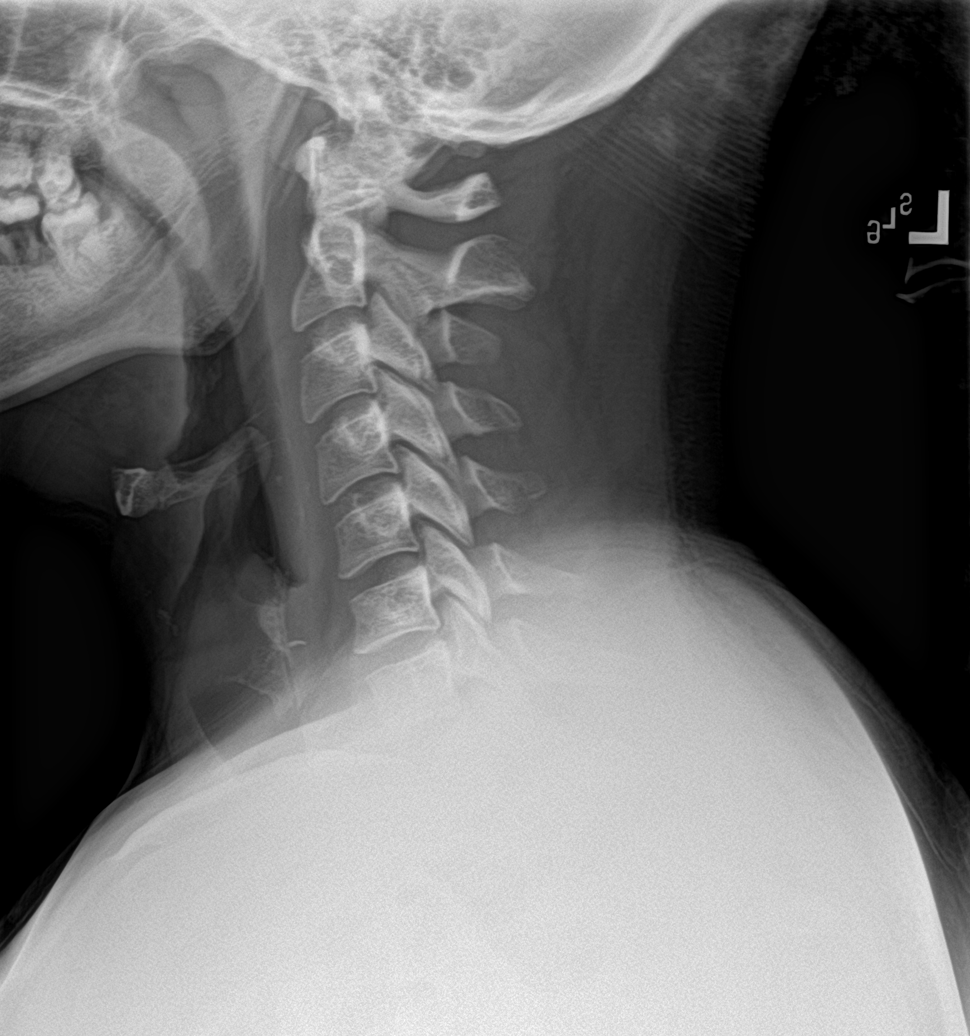
[im 2/5]
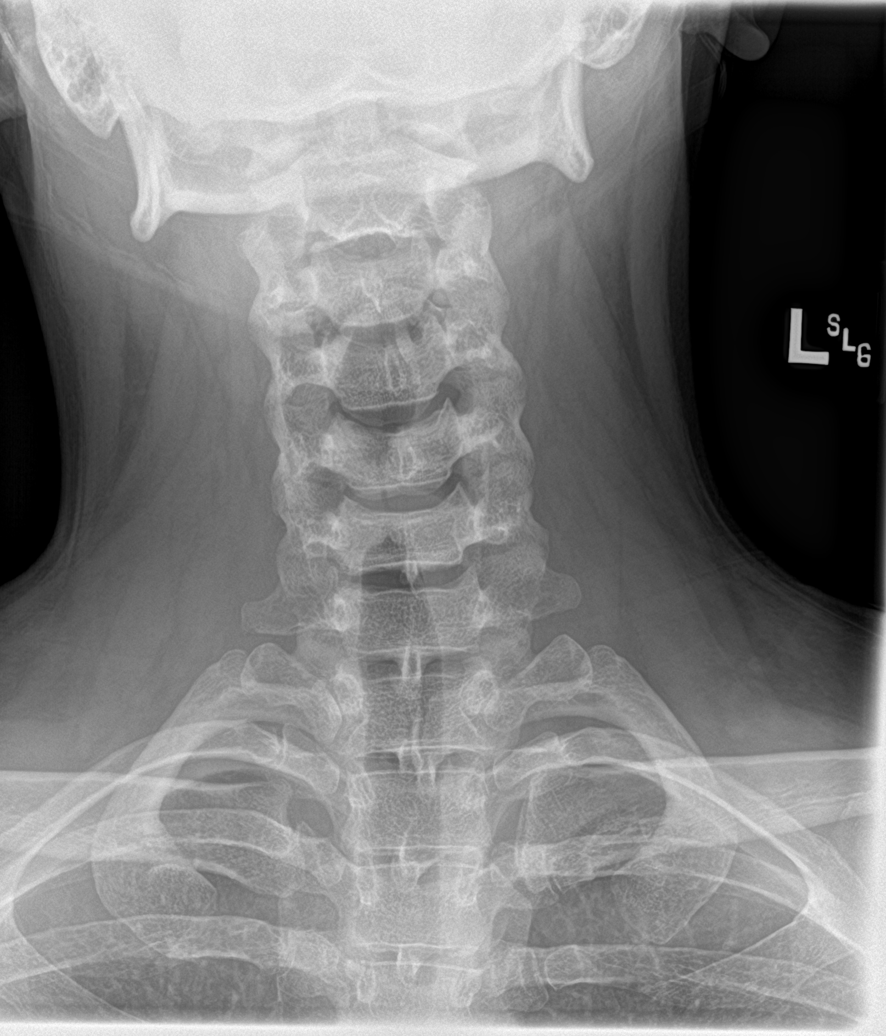
[im 3/5]
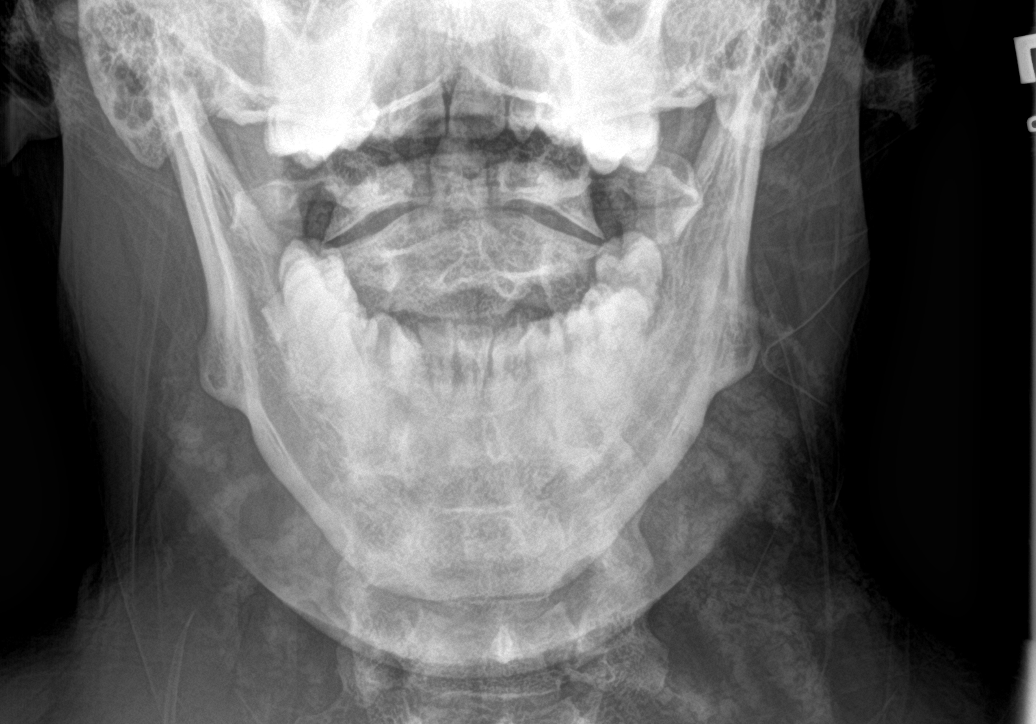
[im 4/5]
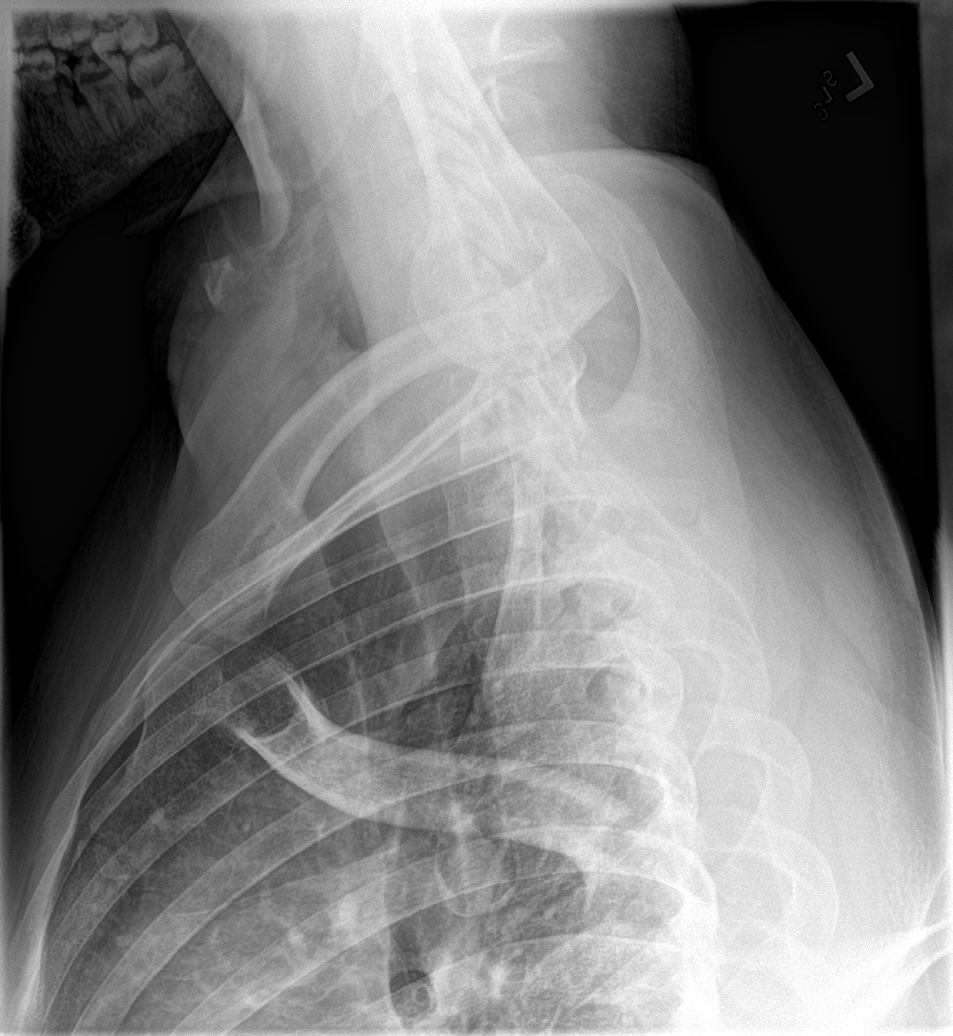
[im 5/5]
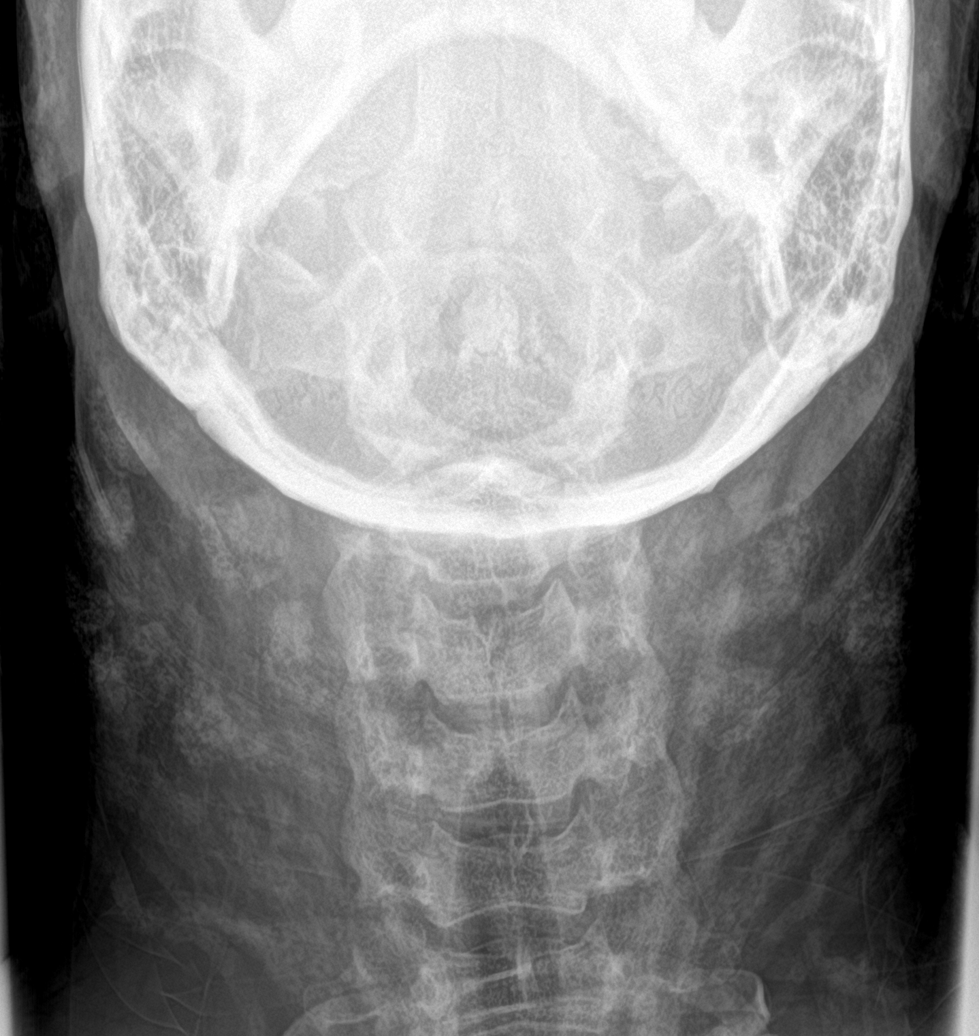

[5 of 5 positions shown; findings below may reference images not displayed]

FINDINGS: Vertebral alignment is normal. No fracture is identified.
Intervertebral disc space heights are preserved. Prevertebral soft
tissues are within normal limits. Visualized lung apices are clear.
IMPRESSION: Negative cervical spine radiographs.

## 2024-01-09 ENCOUNTER — Emergency Department
Admission: EM | Admit: 2024-01-09 | Discharge: 2024-01-09 | Disposition: A | Discharge status: Home / Self Care | Payer: Self-pay | Attending: Emergency Medicine | Admitting: Emergency Medicine

## 2024-01-09 ENCOUNTER — Emergency Department: Payer: Self-pay

## 2024-01-09 ENCOUNTER — Other Ambulatory Visit: Payer: Self-pay

## 2024-01-09 DIAGNOSIS — M79621 Pain in right upper arm: Secondary | ICD-10-CM | POA: Insufficient documentation

## 2024-01-09 DIAGNOSIS — R42 Dizziness and giddiness: Secondary | ICD-10-CM | POA: Insufficient documentation

## 2024-01-09 DIAGNOSIS — M79601 Pain in right arm: Secondary | ICD-10-CM

## 2024-01-09 DIAGNOSIS — R0602 Shortness of breath: Secondary | ICD-10-CM | POA: Insufficient documentation

## 2024-01-09 DIAGNOSIS — M79622 Pain in left upper arm: Secondary | ICD-10-CM | POA: Insufficient documentation

## 2024-01-09 HISTORY — DX: Acute embolism and thrombosis of deep veins of unspecified upper extremity: I82.629

## 2024-01-09 LAB — CBC
HCT: 37.8 % — ABNORMAL LOW (ref 39.0–52.0)
Hemoglobin: 13.4 g/dL (ref 13.0–17.0)
MCH: 31.2 pg (ref 26.0–34.0)
MCHC: 35.4 g/dL (ref 30.0–36.0)
MCV: 87.9 fL (ref 80.0–100.0)
Platelets: 298 K/uL (ref 150–400)
RBC: 4.3 MIL/uL (ref 4.22–5.81)
RDW: 13.3 % (ref 11.5–15.5)
WBC: 9.3 K/uL (ref 4.0–10.5)
nRBC: 0 % (ref 0.0–0.2)

## 2024-01-09 LAB — BASIC METABOLIC PANEL WITH GFR
Anion gap: 11 (ref 5–15)
BUN: 19 mg/dL (ref 6–20)
CO2: 24 mmol/L (ref 22–32)
Calcium: 9.7 mg/dL (ref 8.9–10.3)
Chloride: 104 mmol/L (ref 98–111)
Creatinine, Ser: 1.09 mg/dL (ref 0.61–1.24)
GFR, Estimated: >60 mL/min (ref >60)
Glucose, Bld: 95 mg/dL (ref 70–99)
Potassium: 3.8 mmol/L (ref 3.5–5.1)
Sodium: 139 mmol/L (ref 135–145)

## 2024-01-09 LAB — D-DIMER, QUANTITATIVE: D-Dimer, Quant: <0.27 ug{FEU}/mL (ref 0.00–0.50)

## 2024-01-09 NOTE — Discharge Instructions (Signed)
 Please use ibuprofen (Motrin) up to 800 mg every 8 hours, naproxen (Naprosyn) up to 500 mg every 12 hours, and/or acetaminophen (Tylenol) up to 4 g/day for any continued pain.  Please do not use this medication regimen for longer than 7 days

## 2024-01-09 NOTE — ED Triage Notes (Signed)
 Pt to ED for  SOB and dizziness since 30 minutes ago. Then started having sharp elbow and shoulder pain to L side with tingling down L arm.  Hx DVT to R arm. Not on thinners. Blue top sent.   Respirations are unlabored, skin dry.

## 2024-01-09 NOTE — ED Provider Notes (Signed)
 [facility] Provider Note   Event Date/Time   First MD Initiated Contact with Patient 01/09/24 1821     (approximate) History  Dizziness and Shortness of Breath  HPI Kelly Brooks is a 38 y.o. 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  Patient describes this pain as from the elbow to the shoulder as well as some tingling sensation down the left arm into the palm of the hand.  Patient states that he believes this may have come from a recent chest workout where he was using resistance bands that were sliding over this portion of his arms.  Patient states that after this arm pain, he became very anxious believing that he may have a DVT in this arm again and caused shortness of breath with hypoventilation that led to this lightheadedness. ROS: Patient currently denies any vision changes, tinnitus, difficulty speaking, facial droop, sore throat, chest pain, shortness of breath, abdominal pain, nausea/vomiting/diarrhea, dysuria, or weakness/numbness in any extremity   Physical Exam  Triage Vital Signs: ED Triage Vitals  Encounter Vitals Group     BP 01/09/24 1456 (!) 158/102     Girls Systolic BP Percentile --      Girls Diastolic BP Percentile --      Boys Systolic BP Percentile --      Boys Diastolic BP Percentile --      Pulse Rate 01/09/24 1456 97     Resp 01/09/24 1456 20     Temp 01/09/24 1456 99 F (37.2 C)     Temp Source 01/09/24 1456 Oral     SpO2 01/09/24 1456 100 %     Weight 01/09/24 1457 240 lb (108.9 kg)     Height 01/09/24 1457 5' 10 (1.778 m)     Head Circumference --      Peak Flow --      Pain Score 01/09/24 1456 3     Pain Loc --      Pain Education --      Exclude from Growth Chart --    Most recent vital signs: Vitals:   01/09/24 1456  BP: (!) 158/102  Pulse: 97  Resp: 20  Temp:  99 F (37.2 C)  SpO2: 100%   General: Awake, oriented x4. CV:  Good peripheral perfusion. Resp:  Normal effort. Abd:  No distention. Other:  Middle-aged obese African-American male resting comfortably in no acute distress ED Results / Procedures / Treatments  Labs (all labs ordered are listed, but only abnormal results are displayed) Labs Reviewed  CBC - Abnormal; Notable for the following components:      Result Value   HCT 37.8 (*)    All other components within normal limits  BASIC METABOLIC PANEL WITH GFR  D-DIMER, QUANTITATIVE   EKG ED ECG REPORT I, Artist MARLA Kerns, the attending physician, personally viewed and interpreted this ECG. Date: 01/09/2024 EKG Time: 1457 Rate: 68 Rhythm: normal sinus rhythm QRS Axis: normal Intervals: normal ST/T Wave abnormalities: normal Narrative Interpretation: no evidence of acute ischemia RADIOLOGY ED MD interpretation: 2 view chest x-ray interpreted by me shows no evidence of acute abnormalities including no pneumonia, pneumothorax, or widened mediastinum - All radiology independently interpreted and agree with radiology assessment Official radiology report(s): DG Chest 2 View Result Date: 01/09/2024 CLINICAL DATA:  Shortness of breath. EXAM: CHEST -  2 VIEW COMPARISON:  None Available. FINDINGS: Trachea is midline. Heart size normal. Lungs are clear. No pleural fluid. Surgical scattered linear metallic densities overlie the right chest. IMPRESSION: No acute findings. Electronically Signed   By: Newell Eke M.D.   On: 01/09/2024 15:31   PROCEDURES: Critical Care performed: No Procedures MEDICATIONS ORDERED IN ED: Medications - No data to display IMPRESSION / MDM / ASSESSMENT AND PLAN / ED COURSE  I reviewed the triage vital signs and the nursing notes.                             The patient is on the cardiac monitor to evaluate for evidence of arrhythmia and/or significant heart rate changes. Patient's presentation is most  consistent with acute presentation with potential threat to life or bodily function. 38 year old male presents for bilateral upper extremity pain that led to shortness of breath and lightheadedness Given history, exam and workup I have low suspicion for fracture, dislocation, significant ligamentous injury, septic arthritis, gout flare, new autoimmune arthropathy, or gonococcal arthropathy.  Interventions: D-dimer negative Disposition: Discharge home with strict return precautions and instructions for prompt primary care follow up in the next week.   FINAL CLINICAL IMPRESSION(S) / ED DIAGNOSES   Final diagnoses:  Bilateral arm pain  Shortness of breath   Rx / DC Orders   ED Discharge Orders          Ordered    Ambulatory Referral to Primary Care (Establish Care)        01/09/24 2035           Note:  This document was prepared using Dragon voice recognition software and may include unintentional dictation errors.   [name], MD 01/09/24 [phone]
